# Patient Record
Sex: Female | Born: 1970 | Race: White | Hispanic: No | Marital: Married | Smoking: Never smoker
Health system: Southern US, Community
[De-identification: ages and names within clinical notes are randomized; demographics above are authoritative.]

## PROBLEM LIST (undated history)

## (undated) DIAGNOSIS — I10 Essential (primary) hypertension: Secondary | ICD-10-CM

## (undated) DIAGNOSIS — M7989 Other specified soft tissue disorders: Secondary | ICD-10-CM

## (undated) DIAGNOSIS — F32A Depression, unspecified: Secondary | ICD-10-CM

## (undated) DIAGNOSIS — E039 Hypothyroidism, unspecified: Secondary | ICD-10-CM

## (undated) DIAGNOSIS — R12 Heartburn: Secondary | ICD-10-CM

## (undated) DIAGNOSIS — E669 Obesity, unspecified: Secondary | ICD-10-CM

## (undated) DIAGNOSIS — K829 Disease of gallbladder, unspecified: Secondary | ICD-10-CM

## (undated) HISTORY — DX: Hypothyroidism, unspecified: E03.9

## (undated) HISTORY — DX: Disease of gallbladder, unspecified: K82.9

## (undated) HISTORY — DX: Other specified soft tissue disorders: M79.89

## (undated) HISTORY — DX: Depression, unspecified: F32.A

## (undated) HISTORY — DX: Essential (primary) hypertension: I10

## (undated) HISTORY — DX: Heartburn: R12

---

## 1998-11-07 ENCOUNTER — Other Ambulatory Visit: Admission: RE | Admit: 1998-11-07 | Discharge: 1998-11-07 | Payer: Self-pay | Admitting: Obstetrics and Gynecology

## 1999-11-08 ENCOUNTER — Other Ambulatory Visit: Admission: RE | Admit: 1999-11-08 | Discharge: 1999-11-08 | Payer: Self-pay | Admitting: Obstetrics and Gynecology

## 2000-01-03 ENCOUNTER — Other Ambulatory Visit: Admission: RE | Admit: 2000-01-03 | Discharge: 2000-01-03 | Payer: Self-pay | Admitting: *Deleted

## 2000-05-19 ENCOUNTER — Other Ambulatory Visit: Admission: RE | Admit: 2000-05-19 | Discharge: 2000-05-19 | Payer: Self-pay | Admitting: *Deleted

## 2000-08-14 ENCOUNTER — Ambulatory Visit (HOSPITAL_BASED_OUTPATIENT_CLINIC_OR_DEPARTMENT_OTHER): Admission: RE | Admit: 2000-08-14 | Discharge: 2000-08-14 | Payer: Self-pay | Admitting: Plastic Surgery

## 2000-09-27 ENCOUNTER — Other Ambulatory Visit: Admission: RE | Admit: 2000-09-27 | Discharge: 2000-09-27 | Payer: Self-pay | Admitting: *Deleted

## 2001-01-20 ENCOUNTER — Other Ambulatory Visit: Admission: RE | Admit: 2001-01-20 | Discharge: 2001-01-20 | Payer: Self-pay | Admitting: Obstetrics and Gynecology

## 2001-02-17 ENCOUNTER — Other Ambulatory Visit: Admission: RE | Admit: 2001-02-17 | Discharge: 2001-02-17 | Payer: Self-pay | Admitting: Obstetrics and Gynecology

## 2002-02-22 ENCOUNTER — Other Ambulatory Visit: Admission: RE | Admit: 2002-02-22 | Discharge: 2002-02-22 | Payer: Self-pay | Admitting: Obstetrics and Gynecology

## 2002-05-02 ENCOUNTER — Encounter: Admission: RE | Admit: 2002-05-02 | Discharge: 2002-05-02 | Payer: Self-pay | Admitting: Cardiology

## 2002-05-02 ENCOUNTER — Encounter: Payer: Self-pay | Admitting: Cardiology

## 2002-05-19 ENCOUNTER — Encounter (INDEPENDENT_AMBULATORY_CARE_PROVIDER_SITE_OTHER): Payer: Self-pay | Admitting: Specialist

## 2002-05-19 ENCOUNTER — Observation Stay (HOSPITAL_COMMUNITY): Admission: RE | Admit: 2002-05-19 | Discharge: 2002-05-19 | Payer: Self-pay | Admitting: General Surgery

## 2003-02-10 ENCOUNTER — Emergency Department (HOSPITAL_COMMUNITY): Admission: EM | Admit: 2003-02-10 | Discharge: 2003-02-10 | Payer: Self-pay | Admitting: Emergency Medicine

## 2003-03-02 ENCOUNTER — Other Ambulatory Visit: Admission: RE | Admit: 2003-03-02 | Discharge: 2003-03-02 | Payer: Self-pay | Admitting: Obstetrics and Gynecology

## 2003-03-15 ENCOUNTER — Encounter: Admission: RE | Admit: 2003-03-15 | Discharge: 2003-06-13 | Payer: Self-pay | Admitting: Cardiology

## 2003-04-30 HISTORY — PX: CHOLECYSTECTOMY: SHX55

## 2005-01-30 ENCOUNTER — Other Ambulatory Visit: Admission: RE | Admit: 2005-01-30 | Discharge: 2005-01-30 | Payer: Self-pay | Admitting: Obstetrics and Gynecology

## 2005-08-20 ENCOUNTER — Inpatient Hospital Stay (HOSPITAL_COMMUNITY): Admission: AD | Admit: 2005-08-20 | Discharge: 2005-08-22 | Payer: Self-pay | Admitting: Obstetrics and Gynecology

## 2005-08-29 HISTORY — PX: EYE SURGERY: SHX253

## 2005-09-05 ENCOUNTER — Ambulatory Visit: Admission: RE | Admit: 2005-09-05 | Discharge: 2005-09-05 | Payer: Self-pay | Admitting: Obstetrics and Gynecology

## 2005-09-30 ENCOUNTER — Other Ambulatory Visit: Admission: RE | Admit: 2005-09-30 | Discharge: 2005-09-30 | Payer: Self-pay | Admitting: Obstetrics and Gynecology

## 2007-07-20 ENCOUNTER — Inpatient Hospital Stay (HOSPITAL_COMMUNITY): Admission: AD | Admit: 2007-07-20 | Discharge: 2007-07-20 | Payer: Self-pay | Admitting: Obstetrics and Gynecology

## 2007-08-04 ENCOUNTER — Inpatient Hospital Stay (HOSPITAL_COMMUNITY): Admission: AD | Admit: 2007-08-04 | Discharge: 2007-08-06 | Payer: Self-pay | Admitting: Obstetrics and Gynecology

## 2007-08-07 ENCOUNTER — Encounter: Admission: RE | Admit: 2007-08-07 | Discharge: 2007-09-05 | Payer: Self-pay | Admitting: Obstetrics and Gynecology

## 2007-08-26 ENCOUNTER — Emergency Department (HOSPITAL_COMMUNITY): Admission: EM | Admit: 2007-08-26 | Discharge: 2007-08-26 | Payer: Self-pay | Admitting: Emergency Medicine

## 2009-01-15 ENCOUNTER — Encounter: Admission: RE | Admit: 2009-01-15 | Discharge: 2009-01-15 | Payer: Self-pay | Admitting: Obstetrics and Gynecology

## 2009-08-31 IMAGING — MG MM DIAGNOSTIC BILATERAL
8 series · 8 of 8 positions shown · non-contrast
Comparison: None

CLINICAL DATA: The patient has been experiencing diffuse shooting
pain throughout the upper half of the left breast for 1 month.

DIGITAL DIAGNOSTIC BILATERAL MAMMOGRAM WITH CAD

[R CC (1 of 2)]
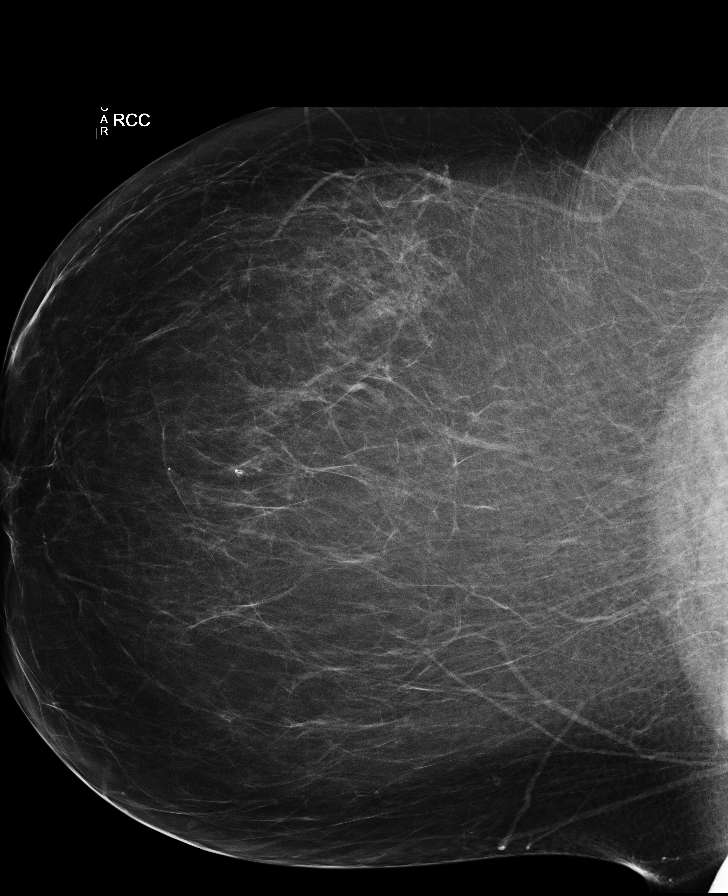

[L CC (1 of 2)]
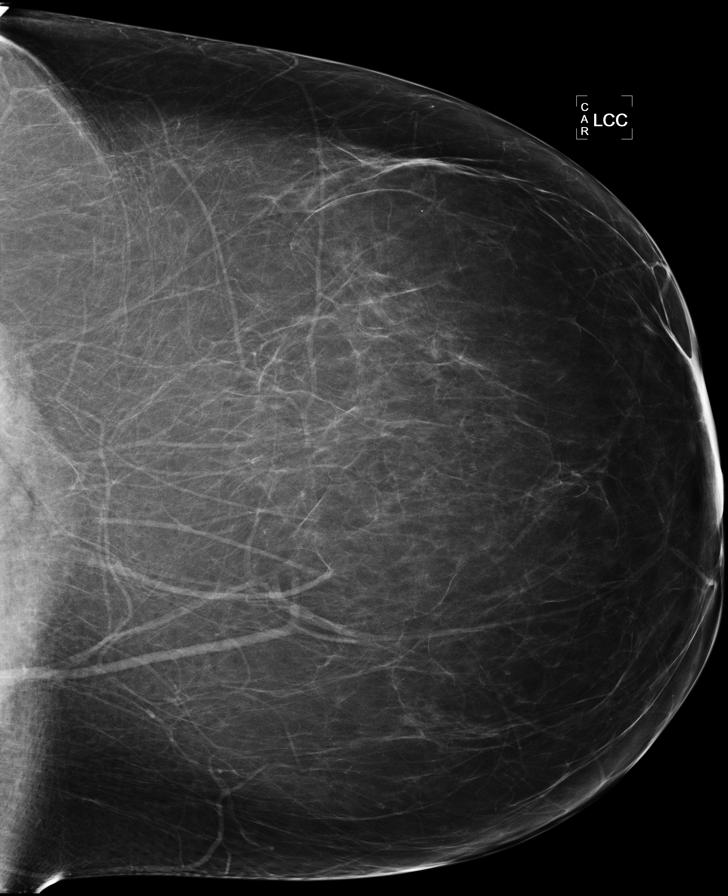

[L MLO (1 of 2)]
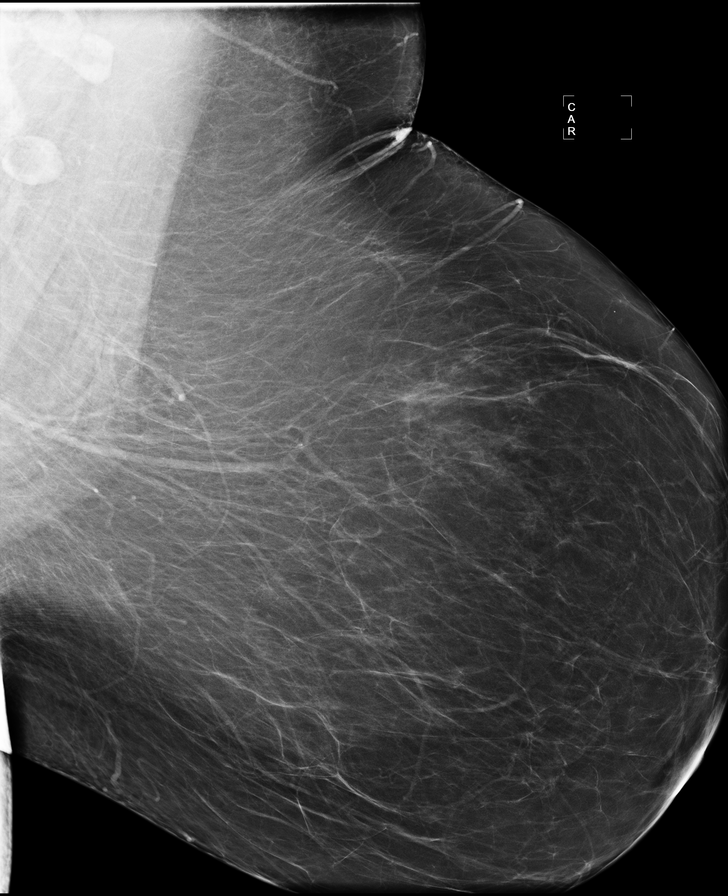

[R MLO (1 of 2)]
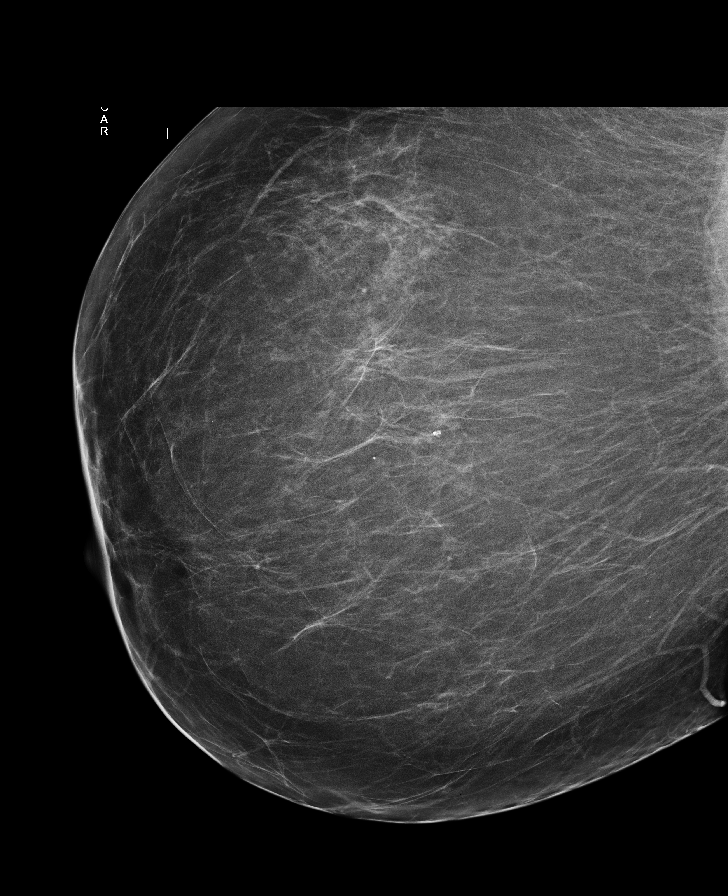

[L CC (2 of 2)]
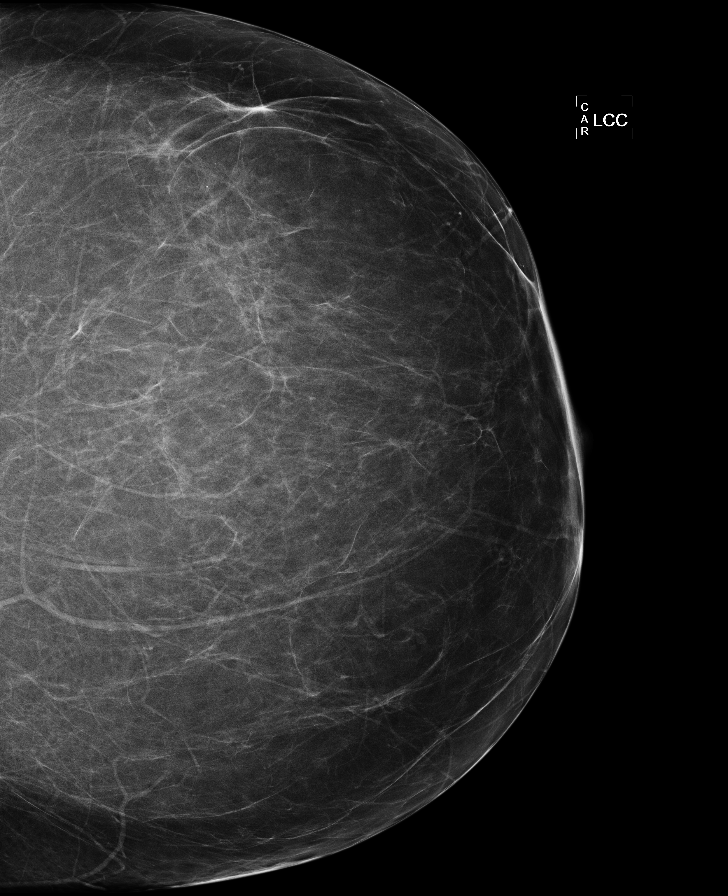

[R CC (2 of 2)]
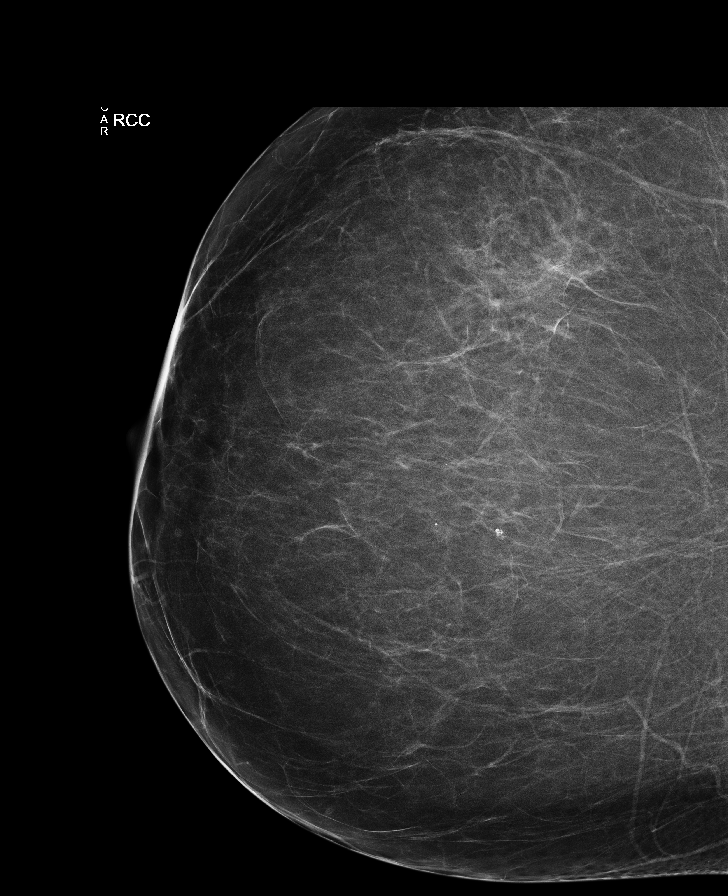

[L MLO (2 of 2)]
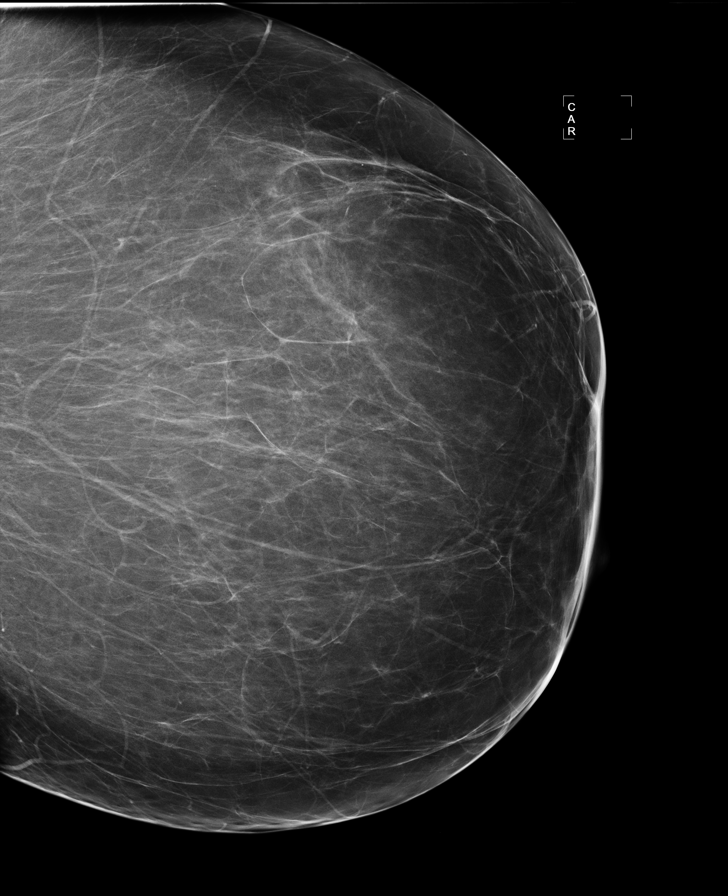

[R MLO (2 of 2)]
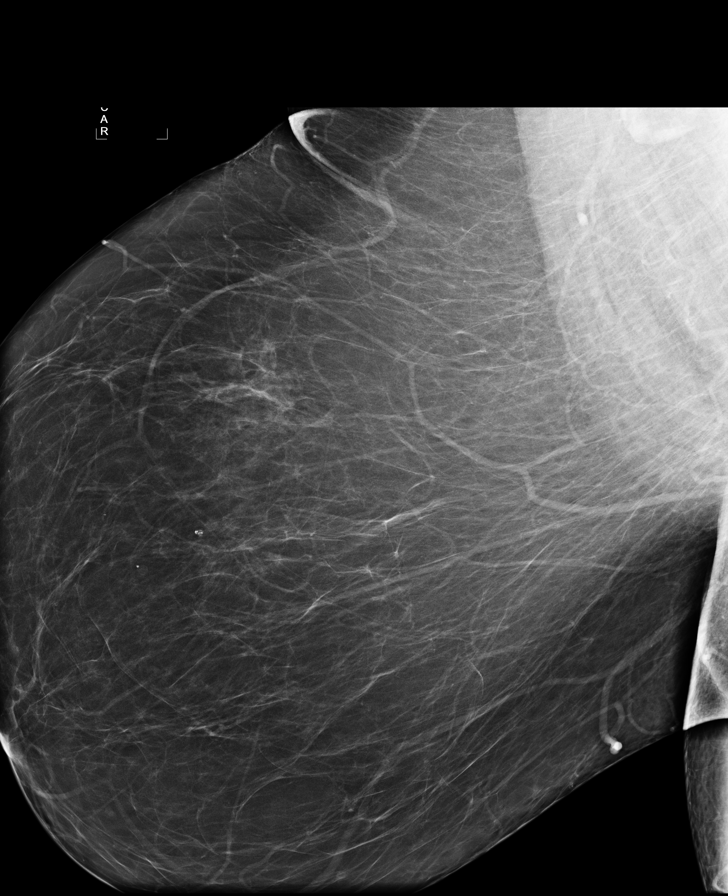

[8 of 8 positions shown; findings below may reference images not displayed]

FINDINGS: The breast tissue is almost entirely fatty.  There is no
dominant mass, architectural distortion or calcification to suggest
malignancy.
IMPRESSION: No mammographic evidence of malignancy.  Yearly screening
mammography should begin at age 40 unless clinically indicated
earlier.

BI-RADS CATEGORY 1:  Negative.

## 2010-03-24 ENCOUNTER — Emergency Department (HOSPITAL_COMMUNITY): Admission: EM | Admit: 2010-03-24 | Discharge: 2010-03-24 | Payer: Self-pay | Admitting: Family Medicine

## 2010-12-15 LAB — POCT RAPID STREP A (OFFICE): Streptococcus, Group A Screen (Direct): NEGATIVE

## 2011-02-14 NOTE — Op Note (Signed)
Tracy [name], Tracy [name]                         ACCOUNT NO.:  [id number]   MEDICAL RECORD NO.:  [id number]                   PATIENT TYPE:  AMB   LOCATION:  DAY                                  FACILITY:  [facility]   PHYSICIAN:  Sharlet Salina T. Hoxworth, M.D.          DATE OF BIRTH:  [birth year]   DATE OF PROCEDURE:  05/19/2002  DATE OF DISCHARGE:                                 OPERATIVE REPORT   PREOPERATIVE DIAGNOSES:  Cholelithiasis and cholecystitis.   POSTOPERATIVE DIAGNOSES:  Cholelithiasis and cholecystitis.   PROCEDURE:  Laparoscopic cholecystectomy.   SURGEON:  Lorne Skeens. Hoxworth, M.D.   ANESTHESIA:  General.   BRIEF HISTORY:  [name] is a 40 year old white female with a recent  episode of severe low substernal and epigastric pain. Cardiac evaluation was  negative and ultrasound has shown a single large gallstone. Laparoscopic  cholecystectomy for symptomatic cholelithiasis has been recommended and  accepted. The nature of the procedure, its indications and risk of bleeding,  infection, bile leak and bile duct injury were discussed and understood  preoperatively. She is now brought to the operating room for this procedure.   DESCRIPTION OF PROCEDURE:  The patient was brought to the operating room,  placed in supine position on the operating table and general endotracheal  anesthesia was induced. She received preoperative antibiotics. The abdomen  was sterilely prepped and draped. PAS were in place. Local anesthesia was  used to infiltrate the trocar sites. A 1 cm incision was made at the  umbilicus and dissection carried down to the midline fascia which was  sharply incised for 1 cm and the peritoneum entered under direct vision.  Through a mattress suture of #0 Vicryl, the Hasson trocar was placed and  pneumoperitoneum established. Under direct vision, a 10 mm trocar was placed  in the subxiphoid area and two 5 mm trocars along the right subcostal  margin. The  gallbladder was visualized and was edematous. The fundus was  grasped and elevated up over the liver and the infundibulum retracted  inferolaterally. Fibrofatty tissue was stripped down off the neck of the  gallbladder toward the porta hepatis. The distal gallbladder was thoroughly  dissected. The cystic artery was clearly seen coursing up onto the  gallbladder wall and was divided between two proximal and one distal clips.  The distal gallbladder cystic duct junction was dissected 360 degrees and  Calot's triangle completely skeletonized. When the anatomy was clear, the  cystic duct was doubly clipped proximally, clipped distally and divided. The  gallbladder was dissected free from its bed using hook cautery and removed  through the umbilicus. Complete hemostasis was assured in the operative site  and the right upper quadrant irrigated and suctioned until clear. Trocars  removed under direct vision and all CO2 evacuated from the peritoneal  cavity. The mattress suture was secured at the umbilicus. The skin incisions  were closed with interrupted subcuticular 4-0 Monocryl and Steri-Strips.  Sponge, needle and instrument counts were correct. Dry sterile dressings  were applied and the patient taken to the recovery room in good condition.                                               Lorne Skeens. Hoxworth, M.D.    Tory Emerald  D:  05/19/2002  T:  05/20/2002  Job:  [id number]

## 2011-07-08 LAB — CBC
HCT: 36.6
Hemoglobin: 12.6
Hemoglobin: 9.7 — ABNORMAL LOW
MCHC: 34.7
MCV: 85.8
MCV: 86.1
Platelets: 263
RBC: 3.26 — ABNORMAL LOW
RDW: 14.6 — ABNORMAL HIGH
RDW: 14.7 — ABNORMAL HIGH

## 2011-07-09 LAB — COMPREHENSIVE METABOLIC PANEL
ALT: 18
AST: 20
Albumin: 2.6 — ABNORMAL LOW
CO2: 23
Calcium: 8.8
Creatinine, Ser: 0.7
GFR calc Af Amer: >60
Sodium: 135
Total Protein: 5.6 — ABNORMAL LOW

## 2011-07-09 LAB — CBC
MCHC: 34.8
MCV: 85
Platelets: 268
RBC: 3.92
WBC: 12.3 — ABNORMAL HIGH

## 2013-07-25 ENCOUNTER — Other Ambulatory Visit: Payer: Self-pay

## 2013-07-25 DIAGNOSIS — Z1231 Encounter for screening mammogram for malignant neoplasm of breast: Secondary | ICD-10-CM

## 2013-10-03 ENCOUNTER — Ambulatory Visit: Payer: Self-pay

## 2014-01-24 ENCOUNTER — Encounter (HOSPITAL_COMMUNITY): Payer: Self-pay | Admitting: Emergency Medicine

## 2014-01-24 ENCOUNTER — Telehealth: Payer: Self-pay | Admitting: Cardiology

## 2014-01-24 ENCOUNTER — Emergency Department (HOSPITAL_COMMUNITY)
Admission: EM | Admit: 2014-01-24 | Discharge: 2014-01-24 | Disposition: A | Discharge status: Home / Self Care | Payer: PRIVATE HEALTH INSURANCE | Attending: Emergency Medicine | Admitting: Emergency Medicine

## 2014-01-24 ENCOUNTER — Emergency Department (HOSPITAL_COMMUNITY): Payer: PRIVATE HEALTH INSURANCE

## 2014-01-24 DIAGNOSIS — E785 Hyperlipidemia, unspecified: Secondary | ICD-10-CM | POA: Insufficient documentation

## 2014-01-24 DIAGNOSIS — R110 Nausea: Secondary | ICD-10-CM | POA: Insufficient documentation

## 2014-01-24 DIAGNOSIS — E669 Obesity, unspecified: Secondary | ICD-10-CM | POA: Insufficient documentation

## 2014-01-24 DIAGNOSIS — M549 Dorsalgia, unspecified: Secondary | ICD-10-CM | POA: Insufficient documentation

## 2014-01-24 DIAGNOSIS — J189 Pneumonia, unspecified organism: Secondary | ICD-10-CM

## 2014-01-24 DIAGNOSIS — I10 Essential (primary) hypertension: Secondary | ICD-10-CM | POA: Insufficient documentation

## 2014-01-24 DIAGNOSIS — J159 Unspecified bacterial pneumonia: Secondary | ICD-10-CM | POA: Insufficient documentation

## 2014-01-24 DIAGNOSIS — Z79899 Other long term (current) drug therapy: Secondary | ICD-10-CM | POA: Insufficient documentation

## 2014-01-24 DIAGNOSIS — R079 Chest pain, unspecified: Secondary | ICD-10-CM

## 2014-01-24 DIAGNOSIS — E119 Type 2 diabetes mellitus without complications: Secondary | ICD-10-CM | POA: Insufficient documentation

## 2014-01-24 HISTORY — DX: Obesity, unspecified: E66.9

## 2014-01-24 LAB — CBC
HCT: 35.3 % — ABNORMAL LOW (ref 36.0–46.0)
Hemoglobin: 11.8 g/dL — ABNORMAL LOW (ref 12.0–15.0)
MCH: 29.5 pg (ref 26.0–34.0)
MCHC: 33.4 g/dL (ref 30.0–36.0)
MCV: 88.3 fL (ref 78.0–100.0)
Platelets: 194 10*3/uL (ref 150–400)
RBC: 4 MIL/uL (ref 3.87–5.11)
RDW: 12.7 % (ref 11.5–15.5)
WBC: 9.4 10*3/uL (ref 4.0–10.5)

## 2014-01-24 LAB — COMPREHENSIVE METABOLIC PANEL
ALBUMIN: 3.7 g/dL (ref 3.5–5.2)
ALT: 16 U/L (ref 0–35)
AST: 18 U/L (ref 0–37)
Alkaline Phosphatase: 43 U/L (ref 39–117)
BILIRUBIN TOTAL: 0.4 mg/dL (ref 0.3–1.2)
BUN: 11 mg/dL (ref 6–23)
CO2: 24 mEq/L (ref 19–32)
CREATININE: 0.77 mg/dL (ref 0.50–1.10)
Calcium: 8.8 mg/dL (ref 8.4–10.5)
Chloride: 103 mEq/L (ref 96–112)
GFR calc non Af Amer: >90 mL/min (ref >90)
Glucose, Bld: 106 mg/dL — ABNORMAL HIGH (ref 70–99)
Potassium: 4.1 mEq/L (ref 3.7–5.3)
Sodium: 141 mEq/L (ref 137–147)
TOTAL PROTEIN: 7.2 g/dL (ref 6.0–8.3)

## 2014-01-24 LAB — I-STAT TROPONIN, ED
TROPONIN I, POC: 0 ng/mL (ref 0.00–0.08)
TROPONIN I, POC: 0.01 ng/mL (ref 0.00–0.08)

## 2014-01-24 LAB — LIPASE, BLOOD: Lipase: 48 U/L (ref 11–59)

## 2014-01-24 MED ORDER — ASPIRIN 81 MG PO CHEW
324.0000 mg | CHEWABLE_TABLET | Freq: Once | ORAL | Status: DC
  Filled 2014-01-24: qty 4

## 2014-01-24 MED ORDER — NITROGLYCERIN 2 % TD OINT
1.0000 [in_us] | TOPICAL_OINTMENT | Freq: Once | TRANSDERMAL | Status: AC
  Administered 2014-01-24: 1 [in_us] via TOPICAL
  Filled 2014-01-24: qty 1

## 2014-01-24 MED ORDER — AZITHROMYCIN 250 MG PO TABS: 250.0000 mg | ORAL_TABLET | Freq: Every day | ORAL | Status: DC

## 2014-01-24 NOTE — ED Notes (Signed)
Pt reports going to pcp this am for eval of mid chest pains, having a cough and  Nausea this am. Also reports a pressure pain to her back. ekg done and pt was sent here for possible ekg changes.

## 2014-01-24 NOTE — ED Notes (Signed)
Pt c/o chest pressure that started last night, radiates into back. Pt c/o extreme nausea this morning, felt a little lightheaded. Went to urgent care, did ekg thought it looked different,  so sent the pt here for further eval. Pt sts she is having slight central chest pressure now, not feeling nauseous now. Nad, skin warm and dry, resp e/u. Took 3 ASA at home and given 4 ASA at urgent care.

## 2014-01-24 NOTE — Discharge Instructions (Signed)
Chest Pain (Nonspecific) °It is often hard to give a specific diagnosis for the cause of chest pain. There is always a chance that your pain could be related to something serious, such as a heart attack or a blood clot in the lungs. You need to follow up with your caregiver for further evaluation. °CAUSES  °· Heartburn. °· Pneumonia or bronchitis. °· Anxiety or stress. °· Inflammation around your heart (pericarditis) or lung (pleuritis or pleurisy). °· A blood clot in the lung. °· A collapsed lung (pneumothorax). It can develop suddenly on its own (spontaneous pneumothorax) or from injury (trauma) to the chest. °· Shingles infection (herpes zoster virus). °The chest wall is composed of bones, muscles, and cartilage. Any of these can be the source of the pain. °· The bones can be bruised by injury. °· The muscles or cartilage can be strained by coughing or overwork. °· The cartilage can be affected by inflammation and become sore (costochondritis). °DIAGNOSIS  °Lab tests or other studies, such as X-rays, electrocardiography, stress testing, or cardiac imaging, may be needed to find the cause of your pain.  °TREATMENT  °· Treatment depends on what may be causing your chest pain. Treatment may include: °· Acid blockers for heartburn. °· Anti-inflammatory medicine. °· Pain medicine for inflammatory conditions. °· Antibiotics if an infection is present. °· You may be advised to change lifestyle habits. This includes stopping smoking and avoiding alcohol, caffeine, and chocolate. °· You may be advised to keep your head raised (elevated) when sleeping. This reduces the chance of acid going backward from your stomach into your esophagus. °· Most of the time, nonspecific chest pain will improve within 2 to 3 days with rest and mild pain medicine. °HOME CARE INSTRUCTIONS  °· If antibiotics were prescribed, take your antibiotics as directed. Finish them even if you start to feel better. °· For the next few days, avoid physical  activities that bring on chest pain. Continue physical activities as directed. °· Do not smoke. °· Avoid drinking alcohol. °· Only take over-the-counter or prescription medicine for pain, discomfort, or fever as directed by your caregiver. °· Follow your caregiver's suggestions for further testing if your chest pain does not go away. °· Keep any follow-up appointments you made. If you do not go to an appointment, you could develop lasting (chronic) problems with pain. If there is any problem keeping an appointment, you must call to reschedule. °SEEK MEDICAL CARE IF:  °· You think you are having problems from the medicine you are taking. Read your medicine instructions carefully. °· Your chest pain does not go away, even after treatment. °· You develop a rash with blisters on your chest. °SEEK IMMEDIATE MEDICAL CARE IF:  °· You have increased chest pain or pain that spreads to your arm, neck, jaw, back, or abdomen. °· You develop shortness of breath, an increasing cough, or you are coughing up blood. °· You have severe back or abdominal pain, feel nauseous, or vomit. °· You develop severe weakness, fainting, or chills. °· You have a fever. °THIS IS AN EMERGENCY. Do not wait to see if the pain will go away. Get medical help at once. Call your local emergency services (911 in [place] Do not drive yourself to the hospital. °MAKE SURE YOU:  °· Understand these instructions. °· Will watch your condition. °· Will get help right away if you are not doing well or get worse. °Document Released: 06/25/2005 Document Revised: 12/08/2011 Document Reviewed: 04/20/2008 °ExitCare® Patient Information ©2014 [employer]   [employer]. ° °Pneumonia, Adult °Pneumonia is an infection of the lungs.  °CAUSES °Pneumonia may be caused by bacteria or a virus. Usually, these infections are caused by breathing infectious particles into the lungs (respiratory tract). °SYMPTOMS  °· Cough. °· Fever. °· Chest pain. °· Increased rate of  breathing. °· Wheezing. °· Mucus production. °DIAGNOSIS  °If you have the common symptoms of pneumonia, your caregiver will typically confirm the diagnosis with a chest X-ray. The X-ray will show an abnormality in the lung (pulmonary infiltrate) if you have pneumonia. Other tests of your blood, urine, or sputum may be done to find the specific cause of your pneumonia. Your caregiver may also do tests (blood gases or pulse oximetry) to see how well your lungs are working. °TREATMENT  °Some forms of pneumonia may be spread to other people when you cough or sneeze. You may be asked to wear a mask before and during your exam. Pneumonia that is caused by bacteria is treated with antibiotic medicine. Pneumonia that is caused by the influenza virus may be treated with an antiviral medicine. Most other viral infections must run their course. These infections will not respond to antibiotics.  °PREVENTION °A pneumococcal shot (vaccine) is available to prevent a common bacterial cause of pneumonia. This is usually suggested for: °· People over 65 years old. °· Patients on chemotherapy. °· People with chronic lung problems, such as bronchitis or emphysema. °· People with immune system problems. °If you are over 65 or have a high risk condition, you may receive the pneumococcal vaccine if you have not received it before. In some countries, a routine influenza vaccine is also recommended. This vaccine can help prevent some cases of pneumonia. You may be offered the influenza vaccine as part of your care. °If you smoke, it is time to quit. You may receive instructions on how to stop smoking. Your caregiver can provide medicines and counseling to help you quit. °HOME CARE INSTRUCTIONS  °· Cough suppressants may be used if you are losing too much rest. However, coughing protects you by clearing your lungs. You should avoid using cough suppressants if you can. °· Your caregiver may have prescribed medicine if he or she thinks your  pneumonia is caused by a bacteria or influenza. Finish your medicine even if you start to feel better. °· Your caregiver may also prescribe an expectorant. This loosens the mucus to be coughed up. °· Only take over-the-counter or prescription medicines for pain, discomfort, or fever as directed by your caregiver. °· Do not smoke. Smoking is a common cause of bronchitis and can contribute to pneumonia. If you are a smoker and continue to smoke, your cough may last several weeks after your pneumonia has cleared. °· A cold steam vaporizer or humidifier in your room or home may help loosen mucus. °· Coughing is often worse at night. Sleeping in a semi-upright position in a recliner or using a couple pillows under your head will help with this. °· Get rest as you feel it is needed. Your body will usually let you know when you need to rest. °SEEK IMMEDIATE MEDICAL CARE IF:  °· Your illness becomes worse. This is especially true if you are elderly or weakened from any other disease. °· You cannot control your cough with suppressants and are losing sleep. °· You begin coughing up blood. °· You develop pain which is getting worse or is uncontrolled with medicines. °· You have a fever. °· Any of the symptoms which initially brought you   in for treatment are getting worse rather than better. °· You develop shortness of breath or chest pain. °MAKE SURE YOU:  °· Understand these instructions. °· Will watch your condition. °· Will get help right away if you are not doing well or get worse. °Document Released: 09/15/2005 Document Revised: 12/08/2011 Document Reviewed: 12/05/2010 °ExitCare® Patient Information ©2014 [employer]. ° °

## 2014-01-24 NOTE — ED Provider Notes (Signed)
CSN: [id number]     Arrival date & time 01/24/14  [id number] History   First MD Initiated Contact with Patient 01/24/14 1012     Chief Complaint  Patient presents with  . Chest Pain  . Back Pain     (Consider location/radiation/quality/duration/timing/severity/associated sxs/prior Treatment) HPI  This is a 43 year old female with history of hyperthyroidism who presents with cough, chest pain, and nausea. Patient reports she had upper chest pain and back pain that started last night. It is pressure-like and nonradiating. She states that it is 2/10. She took an aspirin last night and got better. Patient reports a nonproductive cough as well. She denies any fever or chills. She does endorse nausea. Patient reports that she had a full cardiac workup approximately 10 years ago. She denies any history of high blood pressure, high cholesterol. She does note an early family history of heart disease in her grandmother and her aunt. She denies any leg swelling or history of blood clots.  Past Medical History  Diagnosis Date  . Obesity    History reviewed. No pertinent past surgical history. History reviewed. No pertinent family history. History  Substance Use Topics  . Smoking status: Not on file  . Smokeless tobacco: Not on file  . Alcohol Use: No   OB History   Grav Para Term Preterm Abortions TAB SAB Ect Mult Living                 Review of Systems  Constitutional: Negative for fever.  Respiratory: Positive for cough and chest tightness. Negative for shortness of breath and wheezing.   Cardiovascular: Negative for chest pain.  Gastrointestinal: Positive for nausea. Negative for vomiting and abdominal pain.  Genitourinary: Negative for dysuria.  Musculoskeletal: Positive for back pain.  Neurological: Negative for headaches.  Psychiatric/Behavioral: Negative for confusion.  All other systems reviewed and are negative.     Allergies  Cephalosporins and Ciprofloxacin  Home  Medications   Prior to Admission medications   Medication Sig Start Date End Date Taking? Authorizing Provider  aspirin 81 MG tablet Take 162 mg by mouth daily as needed for pain.   Yes Historical Provider, MD  ibuprofen (ADVIL,MOTRIN) 200 MG tablet Take 600 mg by mouth daily as needed for moderate pain.   Yes Historical Provider, MD  Multiple Vitamins-Minerals (MULTIVITAMIN PO) Take 1 tablet by mouth daily.   Yes Historical Provider, MD  SYNTHROID 150 MCG tablet Take 150 mcg by mouth daily. 01/19/14  Yes Historical Provider, MD   BP 158/81  Pulse 89  Temp(Src) 98.4 F (36.9 C) (Oral)  Resp 15  SpO2 100%  LMP 01/17/2014 Physical Exam  Nursing note and vitals reviewed. Constitutional: She is oriented to person, place, and time. She appears well-developed and well-nourished.  Obese  HENT:  Head: Normocephalic and atraumatic.  Eyes: Pupils are equal, round, and reactive to light.  Neck: Neck supple. No JVD present.  Cardiovascular: Normal rate, regular rhythm and normal heart sounds.   No murmur heard. Pulmonary/Chest: Effort normal and breath sounds normal. No respiratory distress. She has no wheezes.  Abdominal: Soft. There is no tenderness.  Musculoskeletal: She exhibits no edema.  Neurological: She is alert and oriented to person, place, and time.  Skin: Skin is warm and dry.  Psychiatric: She has a normal mood and affect.    ED Course  Procedures (including critical care time) Labs Review Labs Reviewed  CBC - Abnormal; Notable for the following:    Hemoglobin 11.8 (*)  HCT 35.3 (*)    All other components within normal limits  COMPREHENSIVE METABOLIC PANEL - Abnormal; Notable for the following:    Glucose, Bld 106 (*)    All other components within normal limits  LIPASE, BLOOD  I-STAT TROPOININ, ED  Rosezena SensorI-STAT TROPOININ, ED    Imaging Review Dg Chest 2 View  01/24/2014   CLINICAL DATA:  Chest pain and back pain.  Cough.  EXAM: CHEST  2 VIEW  COMPARISON:  None.   FINDINGS: There is a patchy infiltrate at the right lung base. Lungs are otherwise clear. Heart size and vascularity are normal. No effusions. The No osseous abnormality.  IMPRESSION: Faint infiltrate at the right lung base consistent with pneumonia.   Electronically Signed   By: Geanie CooleyJim  Maxwell M.D.   On: 01/24/2014 10:44     EKG Interpretation None      EKG independently reviewed by myself: Normal sinus rhythm with a rate of 94, no evidence of acute ST elevation or ischemia.  MDM   Final diagnoses:  Community acquired pneumonia  Chest pain    Patient presents with chest pain, cough, and nausea. She is nontoxic on exam. EKG is nonischemic.  Patient is low risk for ACS and with a history of diabetes, hypertension, hyperlipidemia. Only risk factor is obesity and family history. Patient also reports cough. Basic labwork was obtained. Chest x-ray shows evidence of right lung base infiltrate consistent with pneumonia. Given patient's cough, will treat with azithromycin. Delta troponin is negative. Patient to followup with cardiology.  After history, exam, and medical workup I feel the patient has been appropriately medically screened and is safe for discharge home. Pertinent diagnoses were discussed with the patient. Patient was given return precautions.     Shon Batonourtney F Horton, MD 01/24/14 1919

## 2014-01-24 NOTE — Telephone Encounter (Signed)
New Message:  Pt states she went to an urgent care and they saw something abnormal on her EKG... States she went to the ER and they told her that her EKG was fine, her troponin levels were good, but she had pneumonia. Pt is requesting Juliette AlcideMelinda call her back and let her know if there is anything else she should do.Marland Kitchen..Marland Kitchen

## 2014-01-24 NOTE — Telephone Encounter (Signed)
Left message to call back  

## 2014-01-24 NOTE — Telephone Encounter (Signed)
Spoke with patient and she went to Urgent Care and EKG abnormal but ED report per Ross Marcusourtney Horton MD-EKG independently reviewed by myself: Normal sinus rhythm with a rate of 94, no evidence of acute ST elevation or ischemia.  Unable to view EKG in chart. Will follow up tomorrow and discuss with  Dr. Patty SermonsBrackbill when he is back in the office

## 2014-01-25 NOTE — Telephone Encounter (Signed)
EKG reviewed by  Dr. Patty SermonsBrackbill and he agreed interpretation. Advised patient

## 2014-09-25 ENCOUNTER — Other Ambulatory Visit: Payer: Self-pay | Admitting: Obstetrics and Gynecology

## 2014-09-27 LAB — CYTOLOGY - PAP

## 2020-02-01 ENCOUNTER — Encounter: Payer: Self-pay | Attending: Family Medicine | Admitting: Registered"

## 2021-03-22 DIAGNOSIS — F411 Generalized anxiety disorder: Secondary | ICD-10-CM | POA: Diagnosis not present

## 2021-04-08 DIAGNOSIS — Z6827 Body mass index (BMI) 27.0-27.9, adult: Secondary | ICD-10-CM | POA: Diagnosis not present

## 2021-04-08 DIAGNOSIS — Z1231 Encounter for screening mammogram for malignant neoplasm of breast: Secondary | ICD-10-CM | POA: Diagnosis not present

## 2021-04-08 DIAGNOSIS — Z01419 Encounter for gynecological examination (general) (routine) without abnormal findings: Secondary | ICD-10-CM | POA: Diagnosis not present

## 2021-04-16 DIAGNOSIS — F5081 Binge eating disorder: Secondary | ICD-10-CM | POA: Diagnosis not present

## 2021-04-16 DIAGNOSIS — Z0001 Encounter for general adult medical examination with abnormal findings: Secondary | ICD-10-CM | POA: Diagnosis not present

## 2021-04-16 DIAGNOSIS — I10 Essential (primary) hypertension: Secondary | ICD-10-CM | POA: Diagnosis not present

## 2021-04-16 DIAGNOSIS — E611 Iron deficiency: Secondary | ICD-10-CM | POA: Diagnosis not present

## 2021-04-16 DIAGNOSIS — E039 Hypothyroidism, unspecified: Secondary | ICD-10-CM | POA: Diagnosis not present

## 2021-04-25 DIAGNOSIS — F411 Generalized anxiety disorder: Secondary | ICD-10-CM | POA: Diagnosis not present

## 2021-05-23 DIAGNOSIS — F411 Generalized anxiety disorder: Secondary | ICD-10-CM | POA: Diagnosis not present

## 2021-07-22 DIAGNOSIS — F411 Generalized anxiety disorder: Secondary | ICD-10-CM | POA: Diagnosis not present

## 2021-07-29 DIAGNOSIS — Z1211 Encounter for screening for malignant neoplasm of colon: Secondary | ICD-10-CM | POA: Diagnosis not present

## 2021-09-09 DIAGNOSIS — F411 Generalized anxiety disorder: Secondary | ICD-10-CM | POA: Diagnosis not present

## 2021-10-07 DIAGNOSIS — F411 Generalized anxiety disorder: Secondary | ICD-10-CM | POA: Diagnosis not present

## 2021-10-08 DIAGNOSIS — K5730 Diverticulosis of large intestine without perforation or abscess without bleeding: Secondary | ICD-10-CM | POA: Diagnosis not present

## 2021-10-08 DIAGNOSIS — Z1211 Encounter for screening for malignant neoplasm of colon: Secondary | ICD-10-CM | POA: Diagnosis not present

## 2021-10-08 DIAGNOSIS — K6389 Other specified diseases of intestine: Secondary | ICD-10-CM | POA: Diagnosis not present

## 2021-11-05 DIAGNOSIS — F411 Generalized anxiety disorder: Secondary | ICD-10-CM | POA: Diagnosis not present

## 2022-01-07 DIAGNOSIS — Z6841 Body Mass Index (BMI) 40.0 and over, adult: Secondary | ICD-10-CM | POA: Diagnosis not present

## 2022-01-07 DIAGNOSIS — E039 Hypothyroidism, unspecified: Secondary | ICD-10-CM | POA: Diagnosis not present

## 2022-01-07 DIAGNOSIS — I10 Essential (primary) hypertension: Secondary | ICD-10-CM | POA: Diagnosis not present

## 2022-01-07 DIAGNOSIS — N959 Unspecified menopausal and perimenopausal disorder: Secondary | ICD-10-CM | POA: Diagnosis not present

## 2022-01-07 DIAGNOSIS — E611 Iron deficiency: Secondary | ICD-10-CM | POA: Diagnosis not present

## 2022-01-07 DIAGNOSIS — E559 Vitamin D deficiency, unspecified: Secondary | ICD-10-CM | POA: Diagnosis not present

## 2022-01-07 DIAGNOSIS — Z0001 Encounter for general adult medical examination with abnormal findings: Secondary | ICD-10-CM | POA: Diagnosis not present

## 2022-01-14 DIAGNOSIS — F411 Generalized anxiety disorder: Secondary | ICD-10-CM | POA: Diagnosis not present

## 2022-01-29 DIAGNOSIS — E2839 Other primary ovarian failure: Secondary | ICD-10-CM | POA: Diagnosis not present

## 2022-01-29 DIAGNOSIS — N951 Menopausal and female climacteric states: Secondary | ICD-10-CM | POA: Diagnosis not present

## 2022-03-12 DIAGNOSIS — E039 Hypothyroidism, unspecified: Secondary | ICD-10-CM | POA: Diagnosis not present

## 2022-03-12 DIAGNOSIS — N951 Menopausal and female climacteric states: Secondary | ICD-10-CM | POA: Diagnosis not present

## 2022-03-12 DIAGNOSIS — E611 Iron deficiency: Secondary | ICD-10-CM | POA: Diagnosis not present

## 2022-03-12 DIAGNOSIS — E2839 Other primary ovarian failure: Secondary | ICD-10-CM | POA: Diagnosis not present

## 2022-03-25 DIAGNOSIS — F411 Generalized anxiety disorder: Secondary | ICD-10-CM | POA: Diagnosis not present

## 2022-04-15 DIAGNOSIS — F411 Generalized anxiety disorder: Secondary | ICD-10-CM | POA: Diagnosis not present

## 2022-04-17 DIAGNOSIS — E039 Hypothyroidism, unspecified: Secondary | ICD-10-CM | POA: Diagnosis not present

## 2022-04-17 DIAGNOSIS — I10 Essential (primary) hypertension: Secondary | ICD-10-CM | POA: Diagnosis not present

## 2022-04-17 DIAGNOSIS — N951 Menopausal and female climacteric states: Secondary | ICD-10-CM | POA: Diagnosis not present

## 2022-04-17 DIAGNOSIS — E611 Iron deficiency: Secondary | ICD-10-CM | POA: Diagnosis not present

## 2022-04-22 DIAGNOSIS — E039 Hypothyroidism, unspecified: Secondary | ICD-10-CM | POA: Diagnosis not present

## 2022-04-22 DIAGNOSIS — I10 Essential (primary) hypertension: Secondary | ICD-10-CM | POA: Diagnosis not present

## 2022-04-22 DIAGNOSIS — E559 Vitamin D deficiency, unspecified: Secondary | ICD-10-CM | POA: Diagnosis not present

## 2022-04-22 DIAGNOSIS — E611 Iron deficiency: Secondary | ICD-10-CM | POA: Diagnosis not present

## 2022-05-13 DIAGNOSIS — F411 Generalized anxiety disorder: Secondary | ICD-10-CM | POA: Diagnosis not present

## 2022-06-18 DIAGNOSIS — D649 Anemia, unspecified: Secondary | ICD-10-CM | POA: Diagnosis not present

## 2022-06-18 DIAGNOSIS — Z1231 Encounter for screening mammogram for malignant neoplasm of breast: Secondary | ICD-10-CM | POA: Diagnosis not present

## 2022-06-18 DIAGNOSIS — Z01419 Encounter for gynecological examination (general) (routine) without abnormal findings: Secondary | ICD-10-CM | POA: Diagnosis not present

## 2022-06-18 DIAGNOSIS — Z6839 Body mass index (BMI) 39.0-39.9, adult: Secondary | ICD-10-CM | POA: Diagnosis not present

## 2022-07-09 DIAGNOSIS — F411 Generalized anxiety disorder: Secondary | ICD-10-CM | POA: Diagnosis not present

## 2022-07-15 DIAGNOSIS — D649 Anemia, unspecified: Secondary | ICD-10-CM | POA: Diagnosis not present

## 2022-07-15 DIAGNOSIS — N911 Secondary amenorrhea: Secondary | ICD-10-CM | POA: Diagnosis not present

## 2022-07-15 DIAGNOSIS — E8941 Symptomatic postprocedural ovarian failure: Secondary | ICD-10-CM | POA: Diagnosis not present

## 2022-08-11 DIAGNOSIS — F411 Generalized anxiety disorder: Secondary | ICD-10-CM | POA: Diagnosis not present

## 2022-08-26 DIAGNOSIS — R946 Abnormal results of thyroid function studies: Secondary | ICD-10-CM | POA: Diagnosis not present

## 2022-09-09 DIAGNOSIS — N958 Other specified menopausal and perimenopausal disorders: Secondary | ICD-10-CM | POA: Diagnosis not present

## 2022-09-09 DIAGNOSIS — F411 Generalized anxiety disorder: Secondary | ICD-10-CM | POA: Diagnosis not present

## 2022-09-09 DIAGNOSIS — E039 Hypothyroidism, unspecified: Secondary | ICD-10-CM | POA: Diagnosis not present

## 2022-11-14 DIAGNOSIS — F411 Generalized anxiety disorder: Secondary | ICD-10-CM | POA: Diagnosis not present

## 2022-12-01 DIAGNOSIS — F411 Generalized anxiety disorder: Secondary | ICD-10-CM | POA: Diagnosis not present

## 2022-12-05 DIAGNOSIS — Z1321 Encounter for screening for nutritional disorder: Secondary | ICD-10-CM | POA: Diagnosis not present

## 2022-12-05 DIAGNOSIS — Z131 Encounter for screening for diabetes mellitus: Secondary | ICD-10-CM | POA: Diagnosis not present

## 2022-12-05 DIAGNOSIS — Z13220 Encounter for screening for lipoid disorders: Secondary | ICD-10-CM | POA: Diagnosis not present

## 2022-12-05 DIAGNOSIS — Z13228 Encounter for screening for other metabolic disorders: Secondary | ICD-10-CM | POA: Diagnosis not present

## 2022-12-18 DIAGNOSIS — R944 Abnormal results of kidney function studies: Secondary | ICD-10-CM | POA: Diagnosis not present

## 2023-01-06 DIAGNOSIS — Z0289 Encounter for other administrative examinations: Secondary | ICD-10-CM

## 2023-01-07 ENCOUNTER — Encounter (INDEPENDENT_AMBULATORY_CARE_PROVIDER_SITE_OTHER): Payer: Self-pay | Admitting: Family Medicine

## 2023-01-12 DIAGNOSIS — F411 Generalized anxiety disorder: Secondary | ICD-10-CM | POA: Diagnosis not present

## 2023-01-19 DIAGNOSIS — K5792 Diverticulitis of intestine, part unspecified, without perforation or abscess without bleeding: Secondary | ICD-10-CM | POA: Diagnosis not present

## 2023-01-28 ENCOUNTER — Ambulatory Visit (INDEPENDENT_AMBULATORY_CARE_PROVIDER_SITE_OTHER): Payer: BC Managed Care – PPO | Admitting: Family Medicine

## 2023-01-28 ENCOUNTER — Encounter (INDEPENDENT_AMBULATORY_CARE_PROVIDER_SITE_OTHER): Payer: Self-pay | Admitting: Family Medicine

## 2023-01-28 VITALS — BP 139/83 | HR 64 | Temp 97.4°F | Ht 62.0 in | Wt 240.8 lb

## 2023-01-28 DIAGNOSIS — Z1331 Encounter for screening for depression: Secondary | ICD-10-CM | POA: Insufficient documentation

## 2023-01-28 DIAGNOSIS — I158 Other secondary hypertension: Secondary | ICD-10-CM | POA: Insufficient documentation

## 2023-01-28 DIAGNOSIS — R5383 Other fatigue: Secondary | ICD-10-CM | POA: Insufficient documentation

## 2023-01-28 DIAGNOSIS — Z6841 Body Mass Index (BMI) 40.0 and over, adult: Secondary | ICD-10-CM

## 2023-01-28 DIAGNOSIS — E038 Other specified hypothyroidism: Secondary | ICD-10-CM | POA: Insufficient documentation

## 2023-01-28 DIAGNOSIS — F32A Depression, unspecified: Secondary | ICD-10-CM | POA: Insufficient documentation

## 2023-01-28 DIAGNOSIS — F3289 Other specified depressive episodes: Secondary | ICD-10-CM | POA: Diagnosis not present

## 2023-01-28 DIAGNOSIS — R0602 Shortness of breath: Secondary | ICD-10-CM | POA: Insufficient documentation

## 2023-01-28 NOTE — Progress Notes (Signed)
Tracy [name], D.O.  ABFM, ABOM Specializing in Clinical Bariatric Medicine Office located at: [address] [place], [place]  [address]     Initial Bariatric Medicine Consultation Visit  Dear Tracy Davenport, MD   Thank you for referring Tracy [name] to our clinic today for evaluation.  We performed a consultation to discuss her options for treatment and educate the patient on her disease state.  The following note includes my evaluation and treatment recommendations.   Please do not hesitate to reach out to me directly if you have any further concerns.    Assessment and Plan:   Orders Placed This Encounter  Procedures   CBC with Differential/Platelet   Comprehensive metabolic panel   Hemoglobin A1c   Folate   Insulin, random   Lipid panel   T3   T4, free   TSH   VITAMIN D 25 Hydroxy (Vit-D Deficiency, Fractures)   Vitamin B12   EKG 12-Lead    Medications Discontinued During This Encounter  Medication Reason   SYNTHROID 150 MCG tablet Change in therapy   aspirin 81 MG tablet Patient Preference   azithromycin (ZITHROMAX) 250 MG tablet Completed Course     1) Fatigue Assessment: Condition is Not optimized. Tracy [name] does feel that her weight is causing her energy to be lower than it should be. Fatigue may be related to obesity, depression or many other causes. she does not appear to have any red flag symptoms and this appears to most likely related to her current lifestyle habits and dietary intake.  Plan:  Labs will be ordered and reviewed with her at their next office visit in two weeks. Epworth sleepiness scale score appears to be within normal limits.  Her ESS score is 7.   Tracy [name] admits to daytime somnolence and admits to waking up still tired. Patient has a history of symptoms of daytime fatigue. Tracy [name] generally gets  5-7  hours of sleep per night, and states that she has generally restful sleep. Snoring is present. Apneic episodes is not present.  ECG:  Sinus bradycardia, rate 55 bpm; reassuring without any acute abnormalities, will continue to monitor for symptoms  Modified PHQ-9 Depression Screen: Her Food and Mood (modified PHQ-9) score was 25 . In the meanwhile, Amonie will focus on self care including making healthy food choices by following their meal plan, improving sleep quality and focusing on stress reduction.  Once we are assured she is on an appropriate meal plan, we will start discussing exercise to increase cardiovascular fitness levels.    2) Shortness of breath on exertion Assessment: Condition is Not optimized. [name] does feel that she gets out of breath more easily than she used to when she exercises and seems to be worsening over time with weight gain.  This has gotten worse recently. Tracy [name] denies shortness of breath at rest or orthopnea. Tracy [name]'s shortness of breath appears to be obesity related and exercise induced, as they do not appear to have any "red flag" symptoms/ concerns today.  Also, this condition appears to be related to a state of poor cardiovascular conditioning   Plan:  Obtain labs today and will be reviewed with her at their next office visit in two weeks. Indirect Calorimeter completed today to help guide our dietary regimen. It shows a VO2 of 238 and a REE of 1642.  Her calculated basal metabolic rate is 6213 thus her measured basal metabolic rate is slightly worse than expected. Patient agreed to work on  weight loss at this time.  As [name] progresses through our weight loss program, we will gradually increase exercise as tolerated to treat her current condition.   If Tracy [name] follows our recommendations and loses 5-10% of their weight without improvement of her shortness of breath or if at any time, symptoms become more concerning, they agree to urgently follow up with their PCP/ specialist for further consideration/ evaluation.   Tracy [name] verbalizes agreement with this plan.    Other secondary hypertension Assessment:  Condition is not optimized. Last 3 blood pressure readings in our office are as follows: BP Readings from Last 3 Encounters:  01/28/23 139/83  01/24/14 158/81  - Patient endorses that she was diagnosed with Hypertension approximately 1 year ago.  - She reports good compliance and tolerance with Hyzaar 50-12.5 mg daily. Denies any side effects. -  Asymptomatic, no concerns.   Plan: - Check labs - Continue with med as recommended by specialist.  - Avoid buying foods that are: processed, frozen, or prepackaged to avoid excess salt. - Ambulatory blood pressure monitoring encouraged.  Reminded patient that if they ever feel poorly in any way, to check their blood pressure and pulse as well. - We will continue to monitor closely alongside PCP/ specialists.  Pt reminded to also f/up with those individuals as instructed by them.  - We will continue to monitor symptoms as they relate to the her weight loss journey.    Other specified hypothyroidism Assessment: Condition is not optimized. - Patient endorses that she has had Hypothyroidism since she was ~52 years old.  - Patient endorses feeling sluggish lately.  - Dr.McComb, her GYN, recently obtained labs that showed high TSH and hence increased dosage of her Thyroid Medication (NP Thyroid), but she has not obtained the higher dose medication yet. She is still taking the lower dose medication.  Plan: - Check labs.  - Patient endorses that she will pick up her higher dose thyroid medication today and she will continue treatment as recommended by Dr.McComb. - Will continue to monitor symptoms.    Other depression-Emotional Eating Assessment: Condition is improving, but not optimized. - Denies any SI/HI. Mood is stable. - Patient endorses that she eats when stressed, sad, bored, guilty, angry, as a reward, and to help comfort herself.  - Modified PHQ-9 Depression Screen: Her Food and Mood (modified PHQ-9) score was elevated at 25  Plan:  -  Check labs. - Patient was referred to Dr. Dewaine Conger, our Bariatric Psychologist, for evaluation due to her elevated PHQ-9 score and significant struggles with emotional eating.  - Importance of not skipping meals and getting all her proteins and fiber in on a daily basis discussed.   - We will continue to monitor closely alongside PCP / other specialists.   TREATMENT PLAN FOR OBESITY: Class 3 severe obesity with serious comorbidity and body mass index (BMI) of 40.0 to 44.9 in adult, unspecified obesity type [facility]) Assessment: Condition is Not optimized. Fat mass is 124.2 lbs.  Muscle mass is 110.8 lbs Total body water is 85 lbs.  Plan:  Debroh is currently in the action stage of change. As such, her goal is to continue weight management plan. Anastasya will work on healthier eating habits and begin Category 2 meal plan with only 100 snack calories.  - I extensively reviewed the Category 2 meal plan with the patient and all of her questions were addressed.   Behavioral Intervention Additional resources provided today: category 2 meal plan information Evidence-based interventions for health  behavior change were utilized today including the discussion of self monitoring techniques, problem-solving barriers and SMART goal setting techniques.   Regarding patient's less desirable eating habits and patterns, we employed the technique of small changes.  Pt will specifically work on: begin Category 2 meal plan with only 100 snack calories  for next visit.    Recommended Physical Activity Goals Talulah has been advised to work up to 150 minutes of moderate intensity aerobic activity a week and strengthening exercises 2-3 times per week for cardiovascular health, weight loss maintenance and preservation of muscle mass.  She has agreed to continue their current level of activity  FOLLOW UP: Follow up in 2 weeks. She was informed of the importance of frequent follow up visits to maximize her success with intensive  lifestyle modifications for her multiple health conditions.  Dalinda D Bigbie is aware that we will review all of her lab results at our next visit.  She is aware that if anything is critical/ life threatening with the results, we will be contacting her via MyChart prior to the office visit to discuss management.   Chief Complaint:   OBESITY Billijo D Weldon (MR# [id number]) is a 52 y.o. female who presents for evaluation and treatment of obesity and related comorbidities. Current BMI is Body mass index is 44.04 kg/m. Emelynn has been struggling with her weight for many years and has been unsuccessful in either losing weight, maintaining weight loss, or reaching her healthy weight goal.  Sharniece D Buczek is currently in the action stage of change and ready to dedicate time achieving and maintaining a healthier weight. Ettel is interested in becoming our patient and working on intensive lifestyle modifications including (but not limited to) diet and exercise for weight loss.  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 Patient is married to [place]  and has 2 children. She lives with her husband and 2 children (69 y.o son and 40 y/o daughter) .  Kaden D Elamin's habits were reviewed today and are as follows:   - She started gaining weight after high school, which she attributes to emotional eating and poor habits.  - She has tried Weight Watchers and Calorie Counting in the past and lost 100 lbs on at least 4 occasions, but regained the weight.   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   - She eats out/has fast food 3-4 times a week.  - She craves sweets, pizza, salty foods. These cravings usually occur in the afternoons.  - She snacks on salty/sweet foods.   - She drinks coffee and sweet tea (both with creamer and sugar)  - Laurette skips meals but snacks very frequently and so she does not feel hungry.  - Her worst habit is snacking and eating so much because "  I'm starting tomorrow".   Subjective:   This is the patient's first visit at [facility].  The patient's NEW PATIENT PACKET that they filled out prior to today's office visit was reviewed at length and information from that paperwork was included within the following office visit note.   Included in the packet: current and past health history, medications, allergies, ROS, gynecologic history (women only), surgical history, family history, social history, weight history, weight loss surgery history (for those that have had weight loss surgery), nutritional evaluation, mood and food questionnaire along with a depression screening (PHQ9) on all patients, an Epworth questionnaire, sleep habits  questionnaire, patient life and health improvement goals questionnaire. These will all be scanned into the patient's chart under the "media" tab.   Review of Systems: Please refer to new patient packet scanned into media. Pertinent positives were addressed with patient today.  Objective:   PHYSICAL EXAM: Blood pressure 139/83, pulse 64, temperature (!) 97.4 F (36.3 C), height 5\' 2"  (1.575 m), weight 240 lb 12.8 oz (109.2 kg), last menstrual period 01/17/2014, SpO2 98 %. Body mass index is 44.04 kg/m. General: Well Developed, well nourished, and in no acute distress.  HEENT: Normocephalic, atraumatic Skin: Warm and dry, cap RF less 2 sec, good turgor Chest:  Normal excursion, shape, no gross abn Respiratory: speaking in full sentences, no conversational dyspnea NeuroM-Sk: Ambulates w/o assistance, moves * 4 Psych: A and O *3, insight good, mood-full  Anthropometric Measurements Height: 5\' 2"  (1.575 m) Weight: 240 lb 12.8 oz (109.2 kg) BMI (Calculated): 44.03 Weight at Last Visit: N/A Weight Lost Since Last Visit: N/A Weight Gained Since Last Visit: N/A Starting Weight: 240.8 lb Peak Weight: 290 lb Waist Measurement : 48 inches   Body Composition  Body Fat %: 51.6 % Fat Mass  (lbs): 124.2 lbs Muscle Mass (lbs): 110.8 lbs Total Body Water (lbs): 85 lbs Visceral Fat Rating : 17   Other Clinical Data RMR: 1642 Fasting: YES Labs: YES Today's Visit #: #1 Starting Date: 01/28/23 Comments: FIRST VISIT    DIAGNOSTIC DATA REVIEWED:  BMET    Component Value Date/Time   NA 141 01/24/2014 1106   K 4.1 01/24/2014 1106   CL 103 01/24/2014 1106   CO2 24 01/24/2014 1106   GLUCOSE 106 (H) 01/24/2014 1106   BUN 11 01/24/2014 1106   CREATININE 0.77 01/24/2014 1106   CALCIUM 8.8 01/24/2014 1106   GFRNONAA >90 01/24/2014 1106   GFRAA >90 01/24/2014 1106   No results found for: "HGBA1C" No results found for: "INSULIN" No results found for: "TSH" CBC    Component Value Date/Time   WBC 9.4 01/24/2014 1106   RBC 4.00 01/24/2014 1106   HGB 11.8 (L) 01/24/2014 1106   HCT 35.3 (L) 01/24/2014 1106   PLT 194 01/24/2014 1106   MCV 88.3 01/24/2014 1106   MCH 29.5 01/24/2014 1106   MCHC 33.4 01/24/2014 1106   RDW 12.7 01/24/2014 1106   Iron Studies No results found for: "IRON", "TIBC", "FERRITIN", "IRONPCTSAT" Lipid Panel  No results found for: "CHOL", "TRIG", "HDL", "CHOLHDL", "VLDL", "LDLCALC", "LDLDIRECT" Hepatic Function Panel     Component Value Date/Time   PROT 7.2 01/24/2014 1106   ALBUMIN 3.7 01/24/2014 1106   AST 18 01/24/2014 1106   ALT 16 01/24/2014 1106   ALKPHOS 43 01/24/2014 1106   BILITOT 0.4 01/24/2014 1106   No results found for: "TSH" Nutritional No results found for: "VD25OH"  Attestation Statements:   Reviewed by clinician on day of visit: allergies, medications, problem list, medical history, surgical history, family history, social history, and previous encounter notes.  During the visit, I independently reviewed the patient's EKG, bioimpedance scale results, and indirect calorimeter results. I used this information to tailor a meal plan for the patient that will help Jacquiline D Chura to lose weight and will improve her  obesity-related conditions going forward.  I performed a medically necessary appropriate examination and/or evaluation. I discussed the assessment and treatment plan with the patient. The patient was provided an opportunity to ask questions and all were answered. The patient agreed with the plan and demonstrated an understanding of  the instructions. Labs were ordered today (unless patient declined them) and will be reviewed with the patient at our next visit unless more critical results need to be addressed immediately. Clinical information was updated and documented in the EMR. Time spent on visit including pre-visit chart review and post-visit care was estimated to be 60  minutes.    I,Special Puri,acting as a Neurosurgeon for [employer], DO.,have documented all relevant documentation on the behalf of Thomasene Lot, DO,as directed by  Thomasene Lot, DO while in the presence of Thomasene Lot, DO.   I, Thomasene Lot, DO, have reviewed all documentation for this visit. The documentation on 01/28/23 for the exam, diagnosis, procedures, and orders are all accurate and complete.

## 2023-01-29 LAB — CBC WITH DIFFERENTIAL/PLATELET
Basophils Absolute: 0.1 10*3/uL (ref 0.0–0.2)
Basos: 1 %
EOS (ABSOLUTE): 0.3 10*3/uL (ref 0.0–0.4)
Eos: 5 %
Hematocrit: 35.4 % (ref 34.0–46.6)
Hemoglobin: 11.3 g/dL (ref 11.1–15.9)
Immature Grans (Abs): 0 10*3/uL (ref 0.0–0.1)
Immature Granulocytes: 1 %
Lymphocytes Absolute: 1.7 10*3/uL (ref 0.7–3.1)
Lymphs: 27 %
MCH: 29.2 pg (ref 26.6–33.0)
MCHC: 31.9 g/dL (ref 31.5–35.7)
MCV: 92 fL (ref 79–97)
Monocytes Absolute: 0.5 10*3/uL (ref 0.1–0.9)
Monocytes: 7 %
Neutrophils Absolute: 3.8 10*3/uL (ref 1.4–7.0)
Neutrophils: 59 %
Platelets: 340 10*3/uL (ref 150–450)
RBC: 3.87 x10E6/uL (ref 3.77–5.28)
RDW: 12.5 % (ref 11.7–15.4)
WBC: 6.4 10*3/uL (ref 3.4–10.8)

## 2023-01-29 LAB — COMPREHENSIVE METABOLIC PANEL
ALT: 17 IU/L (ref 0–32)
AST: 18 IU/L (ref 0–40)
Albumin/Globulin Ratio: 1.7 (ref 1.2–2.2)
Albumin: 4.3 g/dL (ref 3.9–4.9)
Alkaline Phosphatase: 61 IU/L (ref 44–121)
BUN/Creatinine Ratio: 28 — ABNORMAL HIGH (ref 9–23)
BUN: 22 mg/dL (ref 6–24)
Bilirubin Total: <0.2 mg/dL (ref 0.0–1.2)
CO2: 23 mmol/L (ref 20–29)
Calcium: 9.5 mg/dL (ref 8.7–10.2)
Chloride: 102 mmol/L (ref 96–106)
Creatinine, Ser: 0.79 mg/dL (ref 0.57–1.00)
Globulin, Total: 2.6 g/dL (ref 1.5–4.5)
Glucose: 87 mg/dL (ref 70–99)
Potassium: 4.5 mmol/L (ref 3.5–5.2)
Sodium: 141 mmol/L (ref 134–144)
Total Protein: 6.9 g/dL (ref 6.0–8.5)
eGFR: 91 mL/min/{1.73_m2} (ref >59)

## 2023-01-29 LAB — INSULIN, RANDOM: INSULIN: 9 u[IU]/mL (ref 2.6–24.9)

## 2023-01-29 LAB — FOLATE: Folate: 11.2 ng/mL (ref >3.0)

## 2023-01-29 LAB — T4, FREE: Free T4: 0.83 ng/dL (ref 0.82–1.77)

## 2023-01-29 LAB — VITAMIN D 25 HYDROXY (VIT D DEFICIENCY, FRACTURES): Vit D, 25-Hydroxy: 39.2 ng/mL (ref 30.0–100.0)

## 2023-01-29 LAB — LIPID PANEL
Chol/HDL Ratio: 3.3 ratio (ref 0.0–4.4)
Cholesterol, Total: 192 mg/dL (ref 100–199)
HDL: 59 mg/dL (ref >39)
LDL Chol Calc (NIH): 115 mg/dL — ABNORMAL HIGH (ref 0–99)
Triglycerides: 98 mg/dL (ref 0–149)
VLDL Cholesterol Cal: 18 mg/dL (ref 5–40)

## 2023-01-29 LAB — HEMOGLOBIN A1C
Est. average glucose Bld gHb Est-mCnc: 120 mg/dL
Hgb A1c MFr Bld: 5.8 % — ABNORMAL HIGH (ref 4.8–5.6)

## 2023-01-29 LAB — T3: T3, Total: 160 ng/dL (ref 71–180)

## 2023-01-29 LAB — TSH: TSH: 8.5 u[IU]/mL — ABNORMAL HIGH (ref 0.450–4.500)

## 2023-01-29 LAB — VITAMIN B12: Vitamin B-12: 804 pg/mL (ref 232–1245)
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
## 2023-02-11 ENCOUNTER — Ambulatory Visit (INDEPENDENT_AMBULATORY_CARE_PROVIDER_SITE_OTHER): Payer: BC Managed Care – PPO | Admitting: Family Medicine

## 2023-02-11 ENCOUNTER — Encounter (INDEPENDENT_AMBULATORY_CARE_PROVIDER_SITE_OTHER): Payer: Self-pay | Admitting: Family Medicine

## 2023-02-11 VITALS — BP 130/83 | HR 66 | Temp 97.9°F | Ht 62.0 in | Wt 238.0 lb

## 2023-02-11 DIAGNOSIS — Z6841 Body Mass Index (BMI) 40.0 and over, adult: Secondary | ICD-10-CM

## 2023-02-11 DIAGNOSIS — E7849 Other hyperlipidemia: Secondary | ICD-10-CM | POA: Diagnosis not present

## 2023-02-11 DIAGNOSIS — E559 Vitamin D deficiency, unspecified: Secondary | ICD-10-CM

## 2023-02-11 DIAGNOSIS — R7303 Prediabetes: Secondary | ICD-10-CM | POA: Diagnosis not present

## 2023-02-11 DIAGNOSIS — E038 Other specified hypothyroidism: Secondary | ICD-10-CM | POA: Diagnosis not present

## 2023-02-11 DIAGNOSIS — I158 Other secondary hypertension: Secondary | ICD-10-CM

## 2023-02-11 MED ORDER — VITAMIN D (ERGOCALCIFEROL) 1.25 MG (50000 UNIT) PO CAPS
50000.0000 [IU] | ORAL_CAPSULE | ORAL | 0 refills | Status: DC
Start: 2023-02-11 — End: 2023-03-09

## 2023-02-11 NOTE — Progress Notes (Signed)
Tracy [name], D.O.  ABFM, ABOM Specializing in Clinical Bariatric Medicine  Office located at: [address] [place], [place]  [address]     Assessment and Plan:   Meds ordered this encounter  Medications   Vitamin D, Ergocalciferol, (DRISDOL) 1.25 MG (50000 UNIT) CAPS capsule    Sig: Take 1 capsule (50,000 Units total) by mouth every 7 (seven) days.    Dispense:  4 capsule    Refill:  0     Prediabetes  Assessment: Condition is new and not optimized. Labs were reviewed.  Lab Results  Component Value Date   HGBA1C 5.8 (H) 01/28/2023   INSULIN 9.0 01/28/2023   Lab Results  Component Value Date   WBC 6.4 01/28/2023   HGB 11.3 01/28/2023   HCT 35.4 01/28/2023   MCV 92 01/28/2023   PLT 340 01/28/2023   Lab Results  Component Value Date   CREATININE 0.79 01/28/2023   BUN 22 01/28/2023   NA 141 01/28/2023   K 4.5 01/28/2023   CL 102 01/28/2023   CO2 23 01/28/2023      Component Value Date/Time   PROT 6.9 01/28/2023 0955   ALBUMIN 4.3 01/28/2023 0955   AST 18 01/28/2023 0955   ALT 17 01/28/2023 0955   ALKPHOS 61 01/28/2023 0955   BILITOT <0.2 01/28/2023 0955   Lab Results  Component Value Date   FOLATE 11.2 01/28/2023   Lab Results  Component Value Date   VITAMINB12 804 01/28/2023  Labs indicate that: - Her A1c levels are within the prediabetic range (5.7-6.4) - Her Insulin levels are almost two times the normal value.  - Her blood counts are within normal limits and do not indicate anemia. Good compliance and tolerance with Iron 325 mg daily. Denies any side effects. - Her kidney and liver function are stable. However, labs indicate that she is dehydrated.  - Folate levels are within normal limits. - Her B12 levels are within the normal limits of 500+.  Plan:  - Continue with Iron 325 mg daily as recommended by pcp/specialist.  -  Unless pre-existing renal or cardiopulmonary conditions exist which patient was told to limit their fluid intake  by another provider, I recommended roughly one half of their weight in pounds, to be the approximate ounces of non-caloric, non-caffeinated beverages they should drink per day; including more if they are engaging in exercise.  - I counseled patient on pathophysiology of the disease process of Pre-DM.   - Stressed importance of dietary and lifestyle modifications to result in weight loss as first line txmnt - Continue to decrease simple carbs/ sugars; increase fiber and proteins -> follow her meal plan.   - Explained role of simple carbs and insulin levels on hunger and cravings - Handouts provided at pt's request after education provided.  All concerns/questions addressed.   - Tracy [name] will continue to work on weight loss, exercise, via their meal plan we devised to help decrease the risk of progressing to diabetes.  - We will recheck A1c and fasting insulin level in approximately 3 months from last check, or as deemed appropriate.    Vitamin D deficiency  Assessment: Condition is new and  not at goal. Labs were reviewed.  Lab Results  Component Value Date   VD25OH 39.2 01/28/2023  - Labs indicate that her Vitamin D levels are not within the normal limits of 50-80.  - She is currently not on any Vitamin D medication. This is diet controlled.  Plan: - Begin Ergocalciferol 50K IU weekly.  - I discussed the importance of vitamin D to the patient's health and well-being as well as to their ability to lose weight.  - I reviewed possible symptoms of low Vitamin D:  low energy, depressed mood, muscle aches, joint aches, osteoporosis etc. with patient - It has been show that administration of vitamin D supplementation leads to improved satiety and a decrease in inflammatory markers.  Hence, low Vitamin D levels may be linked to an increased risk of cardiovascular events and even increased risk of cancers- such as colon and breast. - ideal vitamin D levels reviewed with patient   - Informed patient this  may be a lifelong thing, and she was encouraged to continue to take the medicine until told otherwise.    - weight loss will likely improve availability of vitamin D, thus encouraged Tracy [name] to continue with meal plan and their weight loss efforts to further improve this condition.  Thus, we will need to monitor levels regularly (every 3-4 mo on average) to keep levels within normal limits and prevent over supplementation. - pt's questions and concerns regarding this condition addressed.     Other specified hypothyroidism Assessment: Condition is not optimized. Labs were reviewed.  Lab Results  Component Value Date   TSH 8.500 (H) 01/28/2023   FREET4 0.83 01/28/2023  - Labs indicate that her FREET4 is close to being too low, which is likely why her TSH has increased.  - She reports good compliance and tolerance with NP Thyroid 120 mg daily. Denies any side effects.  Plan: - Continue with thyroid medication and treatment as recommended by endocrinologist - Will continue to monitor as it relates to her weight loss journey.    Other Hyperlipidemia Assessment: Condition is new not optimized. Labs were reviewed.  Lab Results  Component Value Date   CHOL 192 01/28/2023   HDL 59 01/28/2023   LDLCALC 115 (H) 01/28/2023   TRIG 98 01/28/2023   CHOLHDL 3.3 01/28/2023   The 10-year ASCVD risk score (Arnett DK, et al., 2019) is: 1.5%   Values used to calculate the score:     Age: 52 years     Sex: Female     Is Non-Hispanic African American: No     Diabetic: No     Tobacco smoker: No     Systolic Blood Pressure: 130 mmHg     Is BP treated: Yes     HDL Cholesterol: 59 mg/dL     Total Cholesterol: 192 mg/dL   Labs indicate that:  - Her LDL levels are slightly elevated and above the normal limits of 0-99 mg/dl.   Plan:  -  I don't believe that she needs to be on any cholesterol medication at the moment because her ASCVD risk score is less than 5%  - I stressed the importance that patient  continue with our prudent nutritional plan that is low in saturated and trans fats, and low in fatty carbs to improve these numbers.  - We will continue routine screening as patient continues to achieve health goals along their weight loss journey     TREATMENT PLAN FOR OBESITY: Class 3 severe obesity with serious comorbidity and body mass index (BMI) of 40.0 to 44.9 in adult, unspecified obesity type [facility]) Assessment:  Jeannelle D Wrzesinski is here to discuss her progress with her obesity treatment plan along with follow-up of her obesity related diagnoses. See Medical Weight Management Flowsheet for complete bioelectrical impedance results.  Condition is improving, but not optimized. Biometric data collected today, was reviewed with patient.   Since last office visit patient's Fat mass has decreased by 1.2 lb. Muscle mass has decreased by 1.2 lb. Total body water has decreased by 0.8 lb.  Counseling done on how various foods will affect these numbers and how to maximize success  Total lbs lost to date: 2  Total weight loss percentage to date: 0.83   Plan:  Luv is currently in the action stage of change. As such, her goal is to continue her weight management plan. Tracy [name] will work on healthier eating habits and continue the Category 2 meal plan with breakfast options and only 100 snack calories and begin journaling 1150-1250 calories and 80+ grams of protein daily.  - Discussed different healthy meals that she can have for breakfast. For example, Red's Egg Which [place] Sausage.  - Reviewed the crock pot recipes handout with pt. - Discussed different dressings that she can incorporate into her lunch sandwiches.   Behavioral Intervention Additional resources provided today: Food journaling plan information, breakfast options, and crockpot recipes handout , I.R/Prediabetes Handout Evidence-based interventions for health behavior change were utilized today including the discussion of self monitoring  techniques, problem-solving barriers and SMART goal setting techniques.   Regarding patient's less desirable eating habits and patterns, we employed the technique of small changes.  Pt will specifically work on: increase water intake, focusing on nailing dinner, and begin journaling intake for next visit.    Recommended Physical Activity Goals  Tracy [name] has been advised to slowly work up to 150 minutes of moderate intensity aerobic activity a week and strengthening exercises 2-3 times per week for cardiovascular health, weight loss maintenance and preservation of muscle mass.   She has agreed to Continue current level of physical activity   FOLLOW UP: Return in about 2 weeks (around 02/25/2023). She was informed of the importance of frequent follow up visits to maximize her success with intensive lifestyle modifications for her multiple health conditions.  Subjective:   Chief complaint: Obesity Arrionna is here to discuss her progress with her obesity treatment plan. She is on the the Category 2 Plan with only 100 snack calories and states she is following her eating plan approximately 60 % of the time. She states she is not exercising.   Interval History:  Tracy [name] is here today for her first follow-up office visit since starting the program with [place].    Since last office visit she:  - Reports that it was difficult to follow the meal plan on days that she did not meal prep.  - Endorses that it was easiest to follow the meal plan at breakfast and the hardest at dinner when she returned tired from work.  - For lunch, she usually has a sandwich with [place] and lettuce.  - For snacks, she had a  60 calorie Fruit Bars  - Pt expresses interest in journaling her intake.  - She has an upcoming appointment with Dr.Barker at around  02/16/23.   All blood work/ lab tests that were recently ordered by myself or an outside provider were reviewed with patient today per their request. Extended time was  spent counseling her on all new disease processes that were discovered or preexisting ones that are affected by BMI.  she understands that many of these abnormalities will need to monitored regularly along with the current treatment plan of prudent dietary changes, in which we are making each and every office  visit, to improve these health parameters.  Review of Systems:  Pertinent positives were addressed with patient today.  Weight Summary and Biometrics   Weight Lost Since Last Visit: 2 lb  Weight Gained Since Last Visit: 0   Vitals Temp: 97.9 F (36.6 C) BP: 130/83 Pulse Rate: 66 SpO2: 98 %   Anthropometric Measurements Height: 5\' 2"  (1.575 m) Weight: 238 lb (108 kg) BMI (Calculated): 43.52 Weight at Last Visit: 240 lb Weight Lost Since Last Visit: 2 lb Weight Gained Since Last Visit: 0 Starting Weight: 240.8 lb Total Weight Loss (lbs): 2 lb (0.907 kg) Peak Weight: 290 lb   Body Composition  Body Fat %: 51.5 % Fat Mass (lbs): 123 lbs Muscle Mass (lbs): 110 lbs Total Body Water (lbs): 84.2 lbs Visceral Fat Rating : 16   Other Clinical Data Fasting: No Labs: No Today's Visit #: 2 Starting Date: 01/28/23   Objective:   PHYSICAL EXAM:  Blood pressure 130/83, pulse 66, temperature 97.9 F (36.6 C), height 5\' 2"  (1.575 m), weight 238 lb (108 kg), last menstrual period 01/17/2014, SpO2 98 %. Body mass index is 43.53 kg/m.  General: Well Developed, well nourished, and in no acute distress.  HEENT: Normocephalic, atraumatic Skin: Warm and dry, cap RF less 2 sec, good turgor Chest:  Normal excursion, shape, no gross abn Respiratory: speaking in full sentences, no conversational dyspnea NeuroM-Sk: Ambulates w/o assistance, moves * 4 Psych: A and O *3, insight good, mood-full  DIAGNOSTIC DATA REVIEWED:  BMET    Component Value Date/Time   NA 141 01/28/2023 0955   K 4.5 01/28/2023 0955   CL 102 01/28/2023 0955   CO2 23 01/28/2023 0955   GLUCOSE 87  01/28/2023 0955   GLUCOSE 106 (H) 01/24/2014 1106   BUN 22 01/28/2023 0955   CREATININE 0.79 01/28/2023 0955   CALCIUM 9.5 01/28/2023 0955   GFRNONAA >90 01/24/2014 1106   GFRAA >90 01/24/2014 1106   Lab Results  Component Value Date   HGBA1C 5.8 (H) 01/28/2023   Lab Results  Component Value Date   INSULIN 9.0 01/28/2023   Lab Results  Component Value Date   TSH 8.500 (H) 01/28/2023   CBC    Component Value Date/Time   WBC 6.4 01/28/2023 0955   WBC 9.4 01/24/2014 1106   RBC 3.87 01/28/2023 0955   RBC 4.00 01/24/2014 1106   HGB 11.3 01/28/2023 0955   HCT 35.4 01/28/2023 0955   PLT 340 01/28/2023 0955   MCV 92 01/28/2023 0955   MCH 29.2 01/28/2023 0955   MCH 29.5 01/24/2014 1106   MCHC 31.9 01/28/2023 0955   MCHC 33.4 01/24/2014 1106   RDW 12.5 01/28/2023 0955   Iron Studies No results found for: "IRON", "TIBC", "FERRITIN", "IRONPCTSAT" Lipid Panel     Component Value Date/Time   CHOL 192 01/28/2023 0955   TRIG 98 01/28/2023 0955   HDL 59 01/28/2023 0955   CHOLHDL 3.3 01/28/2023 0955   LDLCALC 115 (H) 01/28/2023 0955   Hepatic Function Panel     Component Value Date/Time   PROT 6.9 01/28/2023 0955   ALBUMIN 4.3 01/28/2023 0955   AST 18 01/28/2023 0955   ALT 17 01/28/2023 0955   ALKPHOS 61 01/28/2023 0955   BILITOT <0.2 01/28/2023 0955      Component Value Date/Time   TSH 8.500 (H) 01/28/2023 0955   Nutritional Lab Results  Component Value Date   VD25OH 39.2 01/28/2023    Attestations:   Reviewed by clinician on day  of visit: allergies, medications, problem list, medical history, surgical history, family history, social history, and previous encounter notes.   Patient was in the office today and time spent on visit including pre-visit chart review and post-visit care/coordination of care and electronic medical record documentation was 44 minutes. 50% of the time was in face to face counseling of this patient's medical condition(s) and providing  education on treatment options to include the first-line treatment of diet and lifestyle modification.   I,Special Puri,acting as a Neurosurgeon for [employer], DO.,have documented all relevant documentation on the behalf of Tracy Lot, DO,as directed by  Tracy Lot, DO while in the presence of Tracy Lot, DO.   I, Tracy Lot, DO, have reviewed all documentation for this visit. The documentation on 02/11/23 for the exam, diagnosis, procedures, and orders are all accurate and complete.

## 2023-02-11 NOTE — Patient Instructions (Signed)
The 10-year ASCVD risk score (Arnett DK, et al., 2019) is: 1.5%   Values used to calculate the score:     Age: 52 years     Sex: Female     Is Non-Hispanic African American: No     Diabetic: No     Tobacco smoker: No     Systolic Blood Pressure: 130 mmHg     Is BP treated: Yes     HDL Cholesterol: 59 mg/dL     Total Cholesterol: 192 mg/dL

## 2023-02-13 DIAGNOSIS — F411 Generalized anxiety disorder: Secondary | ICD-10-CM | POA: Diagnosis not present

## 2023-02-16 ENCOUNTER — Telehealth (INDEPENDENT_AMBULATORY_CARE_PROVIDER_SITE_OTHER): Payer: BC Managed Care – PPO | Admitting: Psychology

## 2023-02-16 DIAGNOSIS — F5089 Other specified eating disorder: Secondary | ICD-10-CM

## 2023-03-09 ENCOUNTER — Ambulatory Visit (INDEPENDENT_AMBULATORY_CARE_PROVIDER_SITE_OTHER): Payer: BC Managed Care – PPO | Admitting: Family Medicine

## 2023-03-09 ENCOUNTER — Encounter (INDEPENDENT_AMBULATORY_CARE_PROVIDER_SITE_OTHER): Payer: Self-pay | Admitting: Family Medicine

## 2023-03-09 VITALS — BP 137/80 | HR 67 | Temp 98.0°F | Ht 62.0 in | Wt 230.0 lb

## 2023-03-09 DIAGNOSIS — E559 Vitamin D deficiency, unspecified: Secondary | ICD-10-CM

## 2023-03-09 DIAGNOSIS — R7303 Prediabetes: Secondary | ICD-10-CM

## 2023-03-09 DIAGNOSIS — Z6841 Body Mass Index (BMI) 40.0 and over, adult: Secondary | ICD-10-CM | POA: Diagnosis not present

## 2023-03-09 DIAGNOSIS — F411 Generalized anxiety disorder: Secondary | ICD-10-CM | POA: Diagnosis not present

## 2023-03-09 MED ORDER — VITAMIN D (ERGOCALCIFEROL) 1.25 MG (50000 UNIT) PO CAPS
50000.0000 [IU] | ORAL_CAPSULE | ORAL | 0 refills | Status: DC
Start: 2023-03-09 — End: 2023-03-23

## 2023-03-09 NOTE — Progress Notes (Signed)
Tracy [name], D.O.  ABFM, ABOM Specializing in Clinical Bariatric Medicine  Office located at: [address] [place], [place]  [address]     Assessment and Plan:   Medications Discontinued During This Encounter  Medication Reason   progesterone (PROMETRIUM) 100 MG capsule Patient Preference   Vitamin D, Ergocalciferol, (DRISDOL) 1.25 MG (50000 UNIT) CAPS capsule Reorder     Meds ordered this encounter  Medications   Vitamin D, Ergocalciferol, (DRISDOL) 1.25 MG (50000 UNIT) CAPS capsule    Sig: Take 1 capsule (50,000 Units total) by mouth every 7 (seven) days.    Dispense:  4 capsule    Refill:  0     Prediabetes Assessment: Condition is diet controlled and not optimized. Her hunger and cravings are controlled when following the prudent nutritional plan.   Lab Results  Component Value Date   HGBA1C 5.8 (H) 01/28/2023   INSULIN 9.0 01/28/2023    Plan: Reminded patient that having protein with each meal is important for controlling hunger and cravings and increasing metabolism. Tracy [name] will continue to work on weight loss, exercise, via their meal plan we devised to help decrease the risk of progressing to diabetes. We will recheck A1c and fasting insulin level in approximately 3 months from last check, or as deemed appropriate.     Vitamin D deficiency Assessment: Condition is not optimized. No issues with Ergocalciferol 50K IU weekly. Denies any N/V/D.   Lab Results  Component Value Date   VD25OH 39.2 01/28/2023   Plan: Continue with OTC supplement. Will reorder this today.  Will continue to monitor levels regularly (every 3-4 mo on average) to keep levels within normal limits and prevent over supplementation.   TREATMENT PLAN FOR OBESITY: Class 3 severe obesity with serious comorbidity and body mass index (BMI) of 40.0 to 44.9 in adult, unspecified obesity type [facility]) Assessment: Tracy [name] is here to discuss her progress with her obesity treatment plan along  with follow-up of her obesity related diagnoses. See Medical Weight Management Flowsheet for complete bioelectrical impedance results.  Condition is not optimized. Biometric data collected today, was reviewed with patient.   Since last office visit on 02/11/23 patient's  Muscle mass has decreased by 4.2 lb. Fat mass has decreased by 4.4 lb. Total body water has increased by 1 lb.  Counseling done on how various foods will affect these numbers and how to maximize success  Total lbs lost to date: 10.8 Total weight loss percentage to date: 4.49    Plan: Continue the Category 2 meal plan with breakfast options and only 100 snack calories and  journaling 1150-1250 calories and 80+ grams of protein daily.   - I again reviewed portions of the meal plan with the pt and showed her how to reach her goal of 80+ grams protein more consistently.   - Reminded patient to choose leaner meats ( for example: ground [place], chicken. Canned tuna).   - Recommended pt to try foods like liquid egg whites to increase her protein intake.    Behavioral Intervention Additional resources provided today: Protein Content of Foods Handout, Food journaling log, Healthy [employer] Evidence-based interventions for health behavior change were utilized today including the discussion of self monitoring techniques, problem-solving barriers and SMART goal setting techniques.   Regarding patient's less desirable eating habits and patterns, we employed the technique of small changes.  Pt will specifically work on: increasing her lean protein intake and journaling her intake/bringing food log  for  next visit.    Recommended Physical Activity Goals  Tracy [name] has been advised to slowly work up to 150 minutes of moderate intensity aerobic activity a week and strengthening exercises 2-3 times per week for cardiovascular health, weight loss maintenance and preservation of muscle mass.   She has agreed to Continue current level of  physical activity    FOLLOW UP: Return in about 2 weeks (around 03/23/2023). She was informed of the importance of frequent follow up visits to maximize her success with intensive lifestyle modifications for her multiple health conditions.   Subjective:   Chief complaint: Obesity Tracy [name] is here to discuss her progress with her obesity treatment plan. She is on the the Category 2 Plan and keeping a food journal and adhering to recommended goals of 1150-1250 calories and 80+ protein and states she is following her eating plan approximately 70% of the time. She states she is not exercising,   Interval History:  Tracy [name] is here for a follow up office visit.     Since last office visit:    - Apart from 2-3 days, Tracy [name] mostly followed the meal plan.   - Her journaling log indicates that on a few occasions, she reached her goal of 80+ grams of protein. She reports that it is challenging to eat this quantity of protein day after day.   - She is not having any challenges with hunger and/or cravings when following the prescribed meal plan.   Pharmacotherapy Current Anti-obesity medications: None Reported side effects: None   Review of Systems:  Pertinent positives were addressed with patient today.  Weight Summary and Biometrics   Weight Lost Since Last Visit: 8lb  Weight Gained Since Last Visit: 0    Vitals Temp: 98 F (36.7 C) BP: 137/80 Pulse Rate: 67 SpO2: 99 %   Anthropometric Measurements Height: 5\' 2"  (1.575 m) Weight: 230 lb (104.3 kg) BMI (Calculated): 42.06 Weight at Last Visit: 238b Weight Lost Since Last Visit: 8lb Weight Gained Since Last Visit: 0 Starting Weight: 240.8 Total Weight Loss (lbs): 10 lb (4.536 kg) Peak Weight: 290lb   Body Composition  Body Fat %: 51.6 % Fat Mass (lbs): 118.6 lbs Muscle Mass (lbs): 105.8 lbs Total Body Water (lbs): 85.2 lbs Visceral Fat Rating : 16   Other Clinical Data Fasting: no Labs: no Today's Visit #:  3 Starting Date: 01/28/23   Objective:   PHYSICAL EXAM: Blood pressure 137/80, pulse 67, temperature 98 F (36.7 C), height 5\' 2"  (1.575 m), weight 230 lb (104.3 kg), last menstrual period 01/17/2014, SpO2 99 %. Body mass index is 42.07 kg/m.  General: Well Developed, well nourished, and in no acute distress.  HEENT: Normocephalic, atraumatic Skin: Warm and dry, cap RF less 2 sec, good turgor Chest:  Normal excursion, shape, no gross abn Respiratory: speaking in full sentences, no conversational dyspnea NeuroM-Sk: Ambulates w/o assistance, moves * 4 Psych: A and O *3, insight good, mood-full  DIAGNOSTIC DATA REVIEWED:  BMET    Component Value Date/Time   NA 141 01/28/2023 0955   K 4.5 01/28/2023 0955   CL 102 01/28/2023 0955   CO2 23 01/28/2023 0955   GLUCOSE 87 01/28/2023 0955   GLUCOSE 106 (H) 01/24/2014 1106   BUN 22 01/28/2023 0955   CREATININE 0.79 01/28/2023 0955   CALCIUM 9.5 01/28/2023 0955   GFRNONAA >90 01/24/2014 1106   GFRAA >90 01/24/2014 1106   Lab Results  Component Value Date   HGBA1C 5.8 (H) 01/28/2023  Lab Results  Component Value Date   INSULIN 9.0 01/28/2023   Lab Results  Component Value Date   TSH 8.500 (H) 01/28/2023   CBC    Component Value Date/Time   WBC 6.4 01/28/2023 0955   WBC 9.4 01/24/2014 1106   RBC 3.87 01/28/2023 0955   RBC 4.00 01/24/2014 1106   HGB 11.3 01/28/2023 0955   HCT 35.4 01/28/2023 0955   PLT 340 01/28/2023 0955   MCV 92 01/28/2023 0955   MCH 29.2 01/28/2023 0955   MCH 29.5 01/24/2014 1106   MCHC 31.9 01/28/2023 0955   MCHC 33.4 01/24/2014 1106   RDW 12.5 01/28/2023 0955   Iron Studies No results found for: "IRON", "TIBC", "FERRITIN", "IRONPCTSAT" Lipid Panel     Component Value Date/Time   CHOL 192 01/28/2023 0955   TRIG 98 01/28/2023 0955   HDL 59 01/28/2023 0955   CHOLHDL 3.3 01/28/2023 0955   LDLCALC 115 (H) 01/28/2023 0955   Hepatic Function Panel     Component Value Date/Time   PROT 6.9  01/28/2023 0955   ALBUMIN 4.3 01/28/2023 0955   AST 18 01/28/2023 0955   ALT 17 01/28/2023 0955   ALKPHOS 61 01/28/2023 0955   BILITOT <0.2 01/28/2023 0955      Component Value Date/Time   TSH 8.500 (H) 01/28/2023 0955   Nutritional Lab Results  Component Value Date   VD25OH 39.2 01/28/2023    Attestations:   Reviewed by clinician on day of visit: allergies, medications, problem list, medical history, surgical history, family history, social history, and previous encounter notes.    I,Special Puri,acting as a Neurosurgeon for [employer], DO.,have documented all relevant documentation on the behalf of Tracy Lot, DO,as directed by  Tracy Lot, DO while in the presence of Tracy Lot, DO.   I, Tracy Lot, DO, have reviewed all documentation for this visit. The documentation on 03/09/23 for the exam, diagnosis, procedures, and orders are all accurate and complete.

## 2023-03-23 ENCOUNTER — Ambulatory Visit (INDEPENDENT_AMBULATORY_CARE_PROVIDER_SITE_OTHER): Payer: BC Managed Care – PPO | Admitting: Family Medicine

## 2023-03-23 ENCOUNTER — Encounter (INDEPENDENT_AMBULATORY_CARE_PROVIDER_SITE_OTHER): Payer: Self-pay | Admitting: Family Medicine

## 2023-03-23 ENCOUNTER — Telehealth (INDEPENDENT_AMBULATORY_CARE_PROVIDER_SITE_OTHER): Payer: BC Managed Care – PPO | Admitting: Psychology

## 2023-03-23 VITALS — BP 123/84 | HR 63 | Temp 97.9°F | Ht 62.0 in | Wt 226.0 lb

## 2023-03-23 DIAGNOSIS — F5089 Other specified eating disorder: Secondary | ICD-10-CM

## 2023-03-23 DIAGNOSIS — E559 Vitamin D deficiency, unspecified: Secondary | ICD-10-CM

## 2023-03-23 DIAGNOSIS — I158 Other secondary hypertension: Secondary | ICD-10-CM | POA: Diagnosis not present

## 2023-03-23 DIAGNOSIS — R7303 Prediabetes: Secondary | ICD-10-CM | POA: Diagnosis not present

## 2023-03-23 DIAGNOSIS — Z6841 Body Mass Index (BMI) 40.0 and over, adult: Secondary | ICD-10-CM

## 2023-03-23 MED ORDER — VITAMIN D (ERGOCALCIFEROL) 1.25 MG (50000 UNIT) PO CAPS
50000.0000 [IU] | ORAL_CAPSULE | ORAL | 0 refills | Status: AC
Start: 2023-03-23 — End: ?

## 2023-03-23 NOTE — Progress Notes (Signed)
  Office: [phone]  /  Fax: [phone]    Date: March 23, 2023  Appointment Start Time: 8:32am Duration: 23 minutes Provider: Lawerance Cruel, Psy.D. Type of Session: Individual Therapy  Location of Patient: Home (private location) Location of Provider: Provider's Home (private office) Type of Contact: Telepsychological Visit via MyChart Video Visit  Session Content: Tracy [name] is a 52 y.o. female presenting for a follow-up appointment to address the previously established treatment goal of increasing coping skills.Today's appointment was a telepsychological visit. Tracy [name] provided verbal consent for today's telepsychological appointment and she is aware she is responsible for securing confidentiality on her end of the session. Prior to proceeding with today's appointment, Tracy [name]'s physical location at the time of this appointment was obtained as well a phone number she could be reached at in the event of technical difficulties. Tracy [name] and this provider participated in today's telepsychological service.   This provider conducted a brief check-in. Tracy [name] reported challenges with her eating habits while attending a camp last week. Since the last appointment with this provider, she denied engagement in binge eating behaviors, restricting food intake, and laxative use. Psychoeducation regarding triggers for emotional eating was provided. Tracy [name] was provided a handout, and encouraged to utilize the handout between now and the next appointment to increase awareness of triggers and frequency. Tracy [name] agreed. This provider also discussed behavioral strategies for specific triggers, such as placing the utensil down when conversing to avoid mindless eating. Tracy [name] provided verbal consent during today's appointment for this provider to send a handout about triggers via e-mail. Overall, Tracy [name] was receptive to today's appointment as evidenced by openness to sharing, responsiveness to feedback, and willingness to explore  triggers for emotional eating.  Mental Status Examination:  Appearance: neat Behavior: appropriate to circumstances Mood: neutral Affect: mood congruent Speech: WNL Eye Contact: appropriate Psychomotor Activity: WNL Gait: unable to assess Thought Process: linear, logical, and goal directed and no evidence or endorsement of suicidal, homicidal, and self-harm ideation, plan and intent  Thought Content/Perception: no hallucinations, delusions, bizarre thinking or behavior endorsed or observed Orientation: AAOx4 Memory/Concentration: memory, attention, language, and fund of knowledge intact  Insight: fair Judgment: fair  Interventions:  Conducted a brief chart review Provided empathic reflections and validation Provided positive reinforcement Employed supportive psychotherapy interventions to facilitate reduced distress and to improve coping skills with identified stressors Psychoeducation provided regarding triggers for emotional eating behaviors  DSM-5 Diagnosis(es):  F50.89 Other Specified Feeding or Eating Disorder, Emotional and Binge Eating Behaviors  Treatment Goal & Progress: During the initial appointment with this provider, the following treatment goal was established: increase coping skills. Progress is limited, as Tracy [name] has just begun treatment with this provider; however, she is receptive to the interaction and interventions and rapport is being established.   Plan: The next appointment is scheduled for 04/06/2023 at 11am, which will be via MyChart Video Visit. The next session will focus on working towards the established treatment goal.

## 2023-03-23 NOTE — Progress Notes (Signed)
Tracy [name], D.O.  ABFM, ABOM Specializing in Clinical Bariatric Medicine  Office located at: [address] [place], [place]  [address]     Assessment and Plan:   Medications Discontinued During This Encounter  Medication Reason   LYLLANA 0.05 MG/24HR patch Patient Preference   Vitamin D, Ergocalciferol, (DRISDOL) 1.25 MG (50000 UNIT) CAPS capsule Reorder     Meds ordered this encounter  Medications   Vitamin D, Ergocalciferol, (DRISDOL) 1.25 MG (50000 UNIT) CAPS capsule    Sig: Take 1 capsule (50,000 Units total) by mouth every 7 (seven) days.    Dispense:  4 capsule    Refill:  0     Prediabetes Assessment: Condition is diet/exercise controlled; pt is not taking any prediabetic medication. She endorses that her hunger and cravings are well controlled when following the meal plan.   Lab Results  Component Value Date   HGBA1C 5.8 (H) 01/28/2023   INSULIN 9.0 01/28/2023    Plan: We briefly discussed Metformin,  which can help [place] in the management of this disease process as well as with weight loss.  Will consider starting this medication in future as we will focus on prudent nutritional plan at this time.   Continue to decrease simple carbs/ sugars; increase fiber and proteins -> follow her meal plan.  Will continue to monitor condition closely alongside PCP.    Other secondary hypertension Assessment: Her blood pressure is stable. No concerns in this regard today. She has been compliant and tolerant with Hyzaar 50-12.5 mg daily. Denies any side effects.  Last 3 blood pressure readings in our office are as follows: BP Readings from Last 3 Encounters:  03/23/23 123/84  03/09/23 137/80  02/11/23 130/83   Plan: Continue with antihypertensive medication as recommended by Specialist.   Continue with Prudent nutritional plan and low sodium diet, advance exercise as tolerated.  Ambulatory blood pressure monitoring encouraged.  Reminded patient that if they ever  feel poorly in any way, to check their blood pressure and pulse as well.We will continue to monitor closely alongside PCP/ specialists.  Pt reminded to also f/up with those individuals as instructed by them. We will continue to monitor symptoms as they relate to the her weight loss journey.    Vitamin D deficiency Assessment: Condition is not optimized is being treated with Ergocalciferol 50K IU weekly. Pt endorses not seeing an improvement in her energy levels yet.   Lab Results  Component Value Date   VD25OH 39.2 01/28/2023   Plan: Continue with prescribed supplement at current dose. Will refill ERGO  today.    Will continue to monitor levels regularly (every 3-4 mo on average) to keep levels within normal limits and prevent over supplementation.   TREATMENT PLAN FOR OBESITY: BMI 40.0-44.9, adult (HCC) - current BMI 41.33 Class 3 severe obesity with serious comorbidity and body mass index (BMI) of 40.0 to 44.9 in adult, unspecified obesity type Tracy [name] - [facility] [name]) Assessment: Tracy [name] is here to discuss her progress with her obesity treatment plan along with follow-up of her obesity related diagnoses. See Medical Weight Management Flowsheet for complete bioelectrical impedance results.  Condition is improving.  Biometric data collected today, was reviewed with patient.   Since last office visit on 03/09/23 patient's  Muscle mass has increased by 1.2 lb. Fat mass has decreased by 4.8 lb. Total body water has decreased by 3.6 lb.  Counseling done on how various foods will affect these numbers and how to maximize success  Total lbs lost to date: 14 Total weight loss percentage to date: 5.83   Plan: Continue the Category 2 meal plan with breakfast options and only 100 snack calories and journaling 1150-1250 calories and 80+ grams of protein daily.   - I again encouraged Tracy [name] to journal her intake for increased mindfulness/awareness about her eating habits.  Behavioral Intervention Additional  resources provided today: Food log handout Evidence-based interventions for health behavior change were utilized today including the discussion of self monitoring techniques, problem-solving barriers and SMART goal setting techniques.   Regarding patient's less desirable eating habits and patterns, we employed the technique of small changes.  Pt will specifically work on: increasing her lean protein intake, journaling her intake/bringing food log, walking a mile every other day for next visit.    Recommended Physical Activity Goals  Tracy [name] has been advised to slowly work up to 150 minutes of moderate intensity aerobic activity a week and strengthening exercises 2-3 times per week for cardiovascular health, weight loss maintenance and preservation of muscle mass.   She has agreed to Continue current level of physical activity   FOLLOW UP: Return in about 2 weeks (around 04/06/2023). She was informed of the importance of frequent follow up visits to maximize her success with intensive lifestyle modifications for her multiple health conditions.  Subjective:   Chief complaint: Obesity Tracy [name] is here to discuss her progress with her obesity treatment plan. She is on the the Category 2 Plan with breakfast options and only 100 snack calories and keeping a food journal and adhering to recommended goals of 1150-1250 calories and 80+protein and states she is following her eating plan approximately 50% of the time. She states she is not doing any formal exercise, however she was walking a [name] during camp.   Interval History:  Tracy [name] is here for a follow up office visit.  Since last OV, Tracy [name] has been doing well. Last wk, she was at a camp and endorses eating off plan foods during that time. However, during the camp, she reports being very active with walking. She has not been journaling her intake, but expresses interest in starting.   Review of Systems:  Pertinent positives were addressed with  patient today.  Reviewed by clinician on day of visit: allergies, medications, problem list, medical history, surgical history, family history, social history, and previous encounter notes.  Weight Summary and Biometrics   Weight Lost Since Last Visit: 4lb  Weight Gained Since Last Visit: 0    Vitals Temp: 97.9 F (36.6 C) BP: 123/84 Pulse Rate: 63 SpO2: 99 %   Anthropometric Measurements Height: 5\' 2"  (1.575 m) Weight: 226 lb (102.5 kg) BMI (Calculated): 41.33 Weight at Last Visit: 230lb Weight Lost Since Last Visit: 4lb Weight Gained Since Last Visit: 0 Starting Weight: 240.8lb Total Weight Loss (lbs): 14 lb (6.35 kg) Peak Weight: 290lb   Body Composition  Body Fat %: 50.2 % Fat Mass (lbs): 113.8 lbs Muscle Mass (lbs): 107 lbs Total Body Water (lbs): 81.6 lbs Visceral Fat Rating : 15   Other Clinical Data Fasting: no Labs: no Today's Visit #: 4 Starting Date: 01/28/23    Objective:   PHYSICAL EXAM: Blood pressure 123/84, pulse 63, temperature 97.9 F (36.6 C), height 5\' 2"  (1.575 m), weight 226 lb (102.5 kg), last menstrual period 01/17/2014, SpO2 99 %. Body mass index is 41.34 kg/m.  General: Well Developed, well nourished, and in no acute distress.  HEENT: Normocephalic, atraumatic Skin: Warm and dry, cap  RF less 2 sec, good turgor Chest:  Normal excursion, shape, no gross abn Respiratory: speaking in full sentences, no conversational dyspnea NeuroM-Sk: Ambulates w/o assistance, moves * 4 Psych: A and O *3, insight good, mood-full  DIAGNOSTIC DATA REVIEWED:  BMET    Component Value Date/Time   NA 141 01/28/2023 0955   K 4.5 01/28/2023 0955   CL 102 01/28/2023 0955   CO2 23 01/28/2023 0955   GLUCOSE 87 01/28/2023 0955   GLUCOSE 106 (H) 01/24/2014 1106   BUN 22 01/28/2023 0955   CREATININE 0.79 01/28/2023 0955   CALCIUM 9.5 01/28/2023 0955   GFRNONAA >90 01/24/2014 1106   GFRAA >90 01/24/2014 1106   Lab Results  Component Value Date    HGBA1C 5.8 (H) 01/28/2023   Lab Results  Component Value Date   INSULIN 9.0 01/28/2023   Lab Results  Component Value Date   TSH 8.500 (H) 01/28/2023   CBC    Component Value Date/Time   WBC 6.4 01/28/2023 0955   WBC 9.4 01/24/2014 1106   RBC 3.87 01/28/2023 0955   RBC 4.00 01/24/2014 1106   HGB 11.3 01/28/2023 0955   HCT 35.4 01/28/2023 0955   PLT 340 01/28/2023 0955   MCV 92 01/28/2023 0955   MCH 29.2 01/28/2023 0955   MCH 29.5 01/24/2014 1106   MCHC 31.9 01/28/2023 0955   MCHC 33.4 01/24/2014 1106   RDW 12.5 01/28/2023 0955   Iron Studies No results found for: "IRON", "TIBC", "FERRITIN", "IRONPCTSAT" Lipid Panel     Component Value Date/Time   CHOL 192 01/28/2023 0955   TRIG 98 01/28/2023 0955   HDL 59 01/28/2023 0955   CHOLHDL 3.3 01/28/2023 0955   LDLCALC 115 (H) 01/28/2023 0955   Hepatic Function Panel     Component Value Date/Time   PROT 6.9 01/28/2023 0955   ALBUMIN 4.3 01/28/2023 0955   AST 18 01/28/2023 0955   ALT 17 01/28/2023 0955   ALKPHOS 61 01/28/2023 0955   BILITOT <0.2 01/28/2023 0955      Component Value Date/Time   TSH 8.500 (H) 01/28/2023 0955   Nutritional Lab Results  Component Value Date   VD25OH 39.2 01/28/2023    Attestations:   I, Tracy [name], acting as a Stage manager for [employer], DO., have compiled all relevant documentation for today's office visit on behalf of Tracy Lot, DO, while in the presence of [employer].  I have reviewed the above documentation for accuracy and completeness, and I agree with the above. Tracy [name], D.O.  The 21st Century Cures Act was signed into law in 2016 which includes the topic of electronic health records.  This provides immediate access to information in MyChart.  This includes consultation notes, operative notes, office notes, lab results and pathology reports.  If you have any questions about what you read please let us know at your next visit so we can  discuss your concerns and take corrective action if need be.  We are right here with you.

## 2023-04-06 ENCOUNTER — Telehealth (INDEPENDENT_AMBULATORY_CARE_PROVIDER_SITE_OTHER): Payer: BC Managed Care – PPO | Admitting: Psychology

## 2023-04-06 DIAGNOSIS — F411 Generalized anxiety disorder: Secondary | ICD-10-CM | POA: Diagnosis not present

## 2023-04-06 DIAGNOSIS — F5089 Other specified eating disorder: Secondary | ICD-10-CM | POA: Diagnosis not present

## 2023-04-06 NOTE — Progress Notes (Signed)
  Office: [phone]  /  Fax: [phone]    Date: April 06, 2023  Appointment Start Time: 11:01am Duration: 19 minutes Provider: Lawerance Cruel, Psy.D. Type of Session: Individual Therapy  Location of Patient: Home (private location) Location of Provider: Provider's Home (private office) Type of Contact: Telepsychological Visit via MyChart Video Visit  Session Content: Tracy [name] is a 52 y.o. female presenting for a follow-up appointment to address the previously established treatment goal of increasing coping skills.Today's appointment was a telepsychological visit. Tracy [name] provided verbal consent for today's telepsychological appointment and she is aware she is responsible for securing confidentiality on her end of the session. Prior to proceeding with today's appointment, Tracy [name]'s physical location at the time of this appointment was obtained as well a phone number she could be reached at in the event of technical difficulties. Tracy [name] and this provider participated in today's telepsychological service.   This provider conducted a brief check-in. Tracy [name] described her eating habits as "off and on." Since the last appointment with this provider, she denied engagement in binge eating behaviors, restricting food intake, and laxative use. She shared about a typical day of eating. Psychoeducation provided regarding self-compassion. Tracy [name] was engaged in a self-compassion exercise to help with eating-related challenges and other ongoing stressors. She was encouraged to regularly ask herself, "What do I need right now?" and "How can I comfort and care for myself in this moment?" Overall, Tracy [name] was receptive to today's appointment as evidenced by openness to sharing, responsiveness to feedback, and willingness to work toward increasing self-compassion.  Mental Status Examination:  Appearance: neat Behavior: appropriate to circumstances Mood: neutral Affect: mood congruent Speech: WNL Eye Contact:  appropriate Psychomotor Activity: WNL Gait: unable to assess Thought Process: linear, logical, and goal directed and no evidence or endorsement of suicidal, homicidal, and self-harm ideation, plan and intent  Thought Content/Perception: no hallucinations, delusions, bizarre thinking or behavior endorsed or observed Orientation: AAOx4 Memory/Concentration: intact Insight: fair Judgment: fair  Interventions:  Conducted a brief chart review Provided empathic reflections and validation Employed supportive psychotherapy interventions to facilitate reduced distress and to improve coping skills with identified stressors Psychoeducation provided regarding self-compassion Engaged pt in a self-compassion exercise  DSM-5 Diagnosis(es): F50.89 Other Specified Feeding or Eating Disorder, Emotional and Binge Eating Behaviors  Treatment Goal & Progress: During the initial appointment with this provider, the following treatment goal was established: increase coping skills. Tracy [name] has demonstrated progress in her goal as evidenced by increased awareness of hunger patterns and increased awareness of triggers for emotional eating behaviors. Tracy [name] also continues to demonstrate willingness to engage in learned skill(s).  Plan: The next appointment is scheduled for 05/04/2023 at 4pm, which will be via MyChart Video Visit. The next session will focus on working towards the established treatment goal.

## 2023-04-13 ENCOUNTER — Ambulatory Visit (INDEPENDENT_AMBULATORY_CARE_PROVIDER_SITE_OTHER): Payer: BC Managed Care – PPO | Admitting: Family Medicine

## 2023-04-13 DIAGNOSIS — Z6841 Body Mass Index (BMI) 40.0 and over, adult: Secondary | ICD-10-CM | POA: Diagnosis not present

## 2023-04-13 DIAGNOSIS — R7989 Other specified abnormal findings of blood chemistry: Secondary | ICD-10-CM | POA: Diagnosis not present

## 2023-04-13 DIAGNOSIS — D649 Anemia, unspecified: Secondary | ICD-10-CM | POA: Diagnosis not present

## 2023-04-13 DIAGNOSIS — E559 Vitamin D deficiency, unspecified: Secondary | ICD-10-CM | POA: Diagnosis not present

## 2023-04-13 DIAGNOSIS — E039 Hypothyroidism, unspecified: Secondary | ICD-10-CM | POA: Diagnosis not present

## 2023-04-13 DIAGNOSIS — R7303 Prediabetes: Secondary | ICD-10-CM | POA: Diagnosis not present

## 2023-04-13 DIAGNOSIS — I10 Essential (primary) hypertension: Secondary | ICD-10-CM | POA: Diagnosis not present

## 2023-04-17 DIAGNOSIS — D649 Anemia, unspecified: Secondary | ICD-10-CM | POA: Diagnosis not present

## 2023-04-17 DIAGNOSIS — E039 Hypothyroidism, unspecified: Secondary | ICD-10-CM | POA: Diagnosis not present

## 2023-04-26 ENCOUNTER — Other Ambulatory Visit (INDEPENDENT_AMBULATORY_CARE_PROVIDER_SITE_OTHER): Payer: Self-pay | Admitting: Family Medicine

## 2023-04-26 DIAGNOSIS — E559 Vitamin D deficiency, unspecified: Secondary | ICD-10-CM

## 2023-04-27 ENCOUNTER — Ambulatory Visit (INDEPENDENT_AMBULATORY_CARE_PROVIDER_SITE_OTHER): Payer: BC Managed Care – PPO | Admitting: Family Medicine

## 2023-05-04 ENCOUNTER — Telehealth (INDEPENDENT_AMBULATORY_CARE_PROVIDER_SITE_OTHER): Payer: BC Managed Care – PPO | Admitting: Psychology

## 2023-06-08 DIAGNOSIS — R944 Abnormal results of kidney function studies: Secondary | ICD-10-CM | POA: Diagnosis not present

## 2023-06-08 DIAGNOSIS — E039 Hypothyroidism, unspecified: Secondary | ICD-10-CM | POA: Diagnosis not present

## 2023-06-08 DIAGNOSIS — E1069 Type 1 diabetes mellitus with other specified complication: Secondary | ICD-10-CM | POA: Diagnosis not present

## 2023-06-08 DIAGNOSIS — I10 Essential (primary) hypertension: Secondary | ICD-10-CM | POA: Diagnosis not present

## 2023-06-22 DIAGNOSIS — F411 Generalized anxiety disorder: Secondary | ICD-10-CM | POA: Diagnosis not present

## 2023-07-13 DIAGNOSIS — I10 Essential (primary) hypertension: Secondary | ICD-10-CM | POA: Diagnosis not present

## 2023-07-13 DIAGNOSIS — E559 Vitamin D deficiency, unspecified: Secondary | ICD-10-CM | POA: Diagnosis not present

## 2023-07-13 DIAGNOSIS — R7301 Impaired fasting glucose: Secondary | ICD-10-CM | POA: Diagnosis not present

## 2023-07-13 DIAGNOSIS — E1069 Type 1 diabetes mellitus with other specified complication: Secondary | ICD-10-CM | POA: Diagnosis not present

## 2023-07-17 DIAGNOSIS — F411 Generalized anxiety disorder: Secondary | ICD-10-CM | POA: Diagnosis not present

## 2023-07-29 DIAGNOSIS — Z01419 Encounter for gynecological examination (general) (routine) without abnormal findings: Secondary | ICD-10-CM | POA: Diagnosis not present

## 2023-07-29 DIAGNOSIS — Z1231 Encounter for screening mammogram for malignant neoplasm of breast: Secondary | ICD-10-CM | POA: Diagnosis not present

## 2023-07-29 DIAGNOSIS — Z6841 Body Mass Index (BMI) 40.0 and over, adult: Secondary | ICD-10-CM | POA: Diagnosis not present

## 2023-07-30 ENCOUNTER — Other Ambulatory Visit (HOSPITAL_BASED_OUTPATIENT_CLINIC_OR_DEPARTMENT_OTHER): Payer: Self-pay | Admitting: Obstetrics and Gynecology

## 2023-07-30 DIAGNOSIS — Z8249 Family history of ischemic heart disease and other diseases of the circulatory system: Secondary | ICD-10-CM

## 2023-08-13 DIAGNOSIS — I10 Essential (primary) hypertension: Secondary | ICD-10-CM | POA: Diagnosis not present

## 2023-08-13 DIAGNOSIS — F331 Major depressive disorder, recurrent, moderate: Secondary | ICD-10-CM | POA: Diagnosis not present

## 2023-08-13 DIAGNOSIS — Z713 Dietary counseling and surveillance: Secondary | ICD-10-CM | POA: Diagnosis not present

## 2023-08-13 DIAGNOSIS — E039 Hypothyroidism, unspecified: Secondary | ICD-10-CM | POA: Diagnosis not present

## 2023-08-31 DIAGNOSIS — F411 Generalized anxiety disorder: Secondary | ICD-10-CM | POA: Diagnosis not present

## 2023-09-07 ENCOUNTER — Ambulatory Visit (HOSPITAL_COMMUNITY): Payer: Self-pay

## 2023-10-12 ENCOUNTER — Ambulatory Visit (HOSPITAL_COMMUNITY)
Admission: RE | Admit: 2023-10-12 | Discharge: 2023-10-12 | Disposition: A | Discharge status: Home / Self Care | Payer: Self-pay | Source: Ambulatory Visit | Attending: Obstetrics and Gynecology | Admitting: Obstetrics and Gynecology

## 2023-10-12 DIAGNOSIS — E559 Vitamin D deficiency, unspecified: Secondary | ICD-10-CM | POA: Diagnosis not present

## 2023-10-12 DIAGNOSIS — I10 Essential (primary) hypertension: Secondary | ICD-10-CM | POA: Diagnosis not present

## 2023-10-12 DIAGNOSIS — D649 Anemia, unspecified: Secondary | ICD-10-CM | POA: Diagnosis not present

## 2023-10-12 DIAGNOSIS — E039 Hypothyroidism, unspecified: Secondary | ICD-10-CM | POA: Diagnosis not present

## 2023-10-12 DIAGNOSIS — Z8249 Family history of ischemic heart disease and other diseases of the circulatory system: Secondary | ICD-10-CM | POA: Insufficient documentation

## 2023-10-20 DIAGNOSIS — E559 Vitamin D deficiency, unspecified: Secondary | ICD-10-CM | POA: Diagnosis not present

## 2023-10-20 DIAGNOSIS — E039 Hypothyroidism, unspecified: Secondary | ICD-10-CM | POA: Diagnosis not present

## 2023-10-20 DIAGNOSIS — E1069 Type 1 diabetes mellitus with other specified complication: Secondary | ICD-10-CM | POA: Diagnosis not present

## 2023-10-20 DIAGNOSIS — N951 Menopausal and female climacteric states: Secondary | ICD-10-CM | POA: Diagnosis not present

## 2023-10-20 DIAGNOSIS — E611 Iron deficiency: Secondary | ICD-10-CM | POA: Diagnosis not present

## 2023-10-20 DIAGNOSIS — Z713 Dietary counseling and surveillance: Secondary | ICD-10-CM | POA: Diagnosis not present

## 2023-11-02 DIAGNOSIS — F411 Generalized anxiety disorder: Secondary | ICD-10-CM | POA: Diagnosis not present

## 2023-12-28 DIAGNOSIS — F411 Generalized anxiety disorder: Secondary | ICD-10-CM | POA: Diagnosis not present

## 2024-02-29 ENCOUNTER — Encounter: Payer: Self-pay | Admitting: Podiatry

## 2024-02-29 NOTE — Progress Notes (Signed)
Patient did not show for scheduled appointment today.

## 2024-04-11 DIAGNOSIS — E611 Iron deficiency: Secondary | ICD-10-CM | POA: Diagnosis not present

## 2024-04-11 DIAGNOSIS — Z0001 Encounter for general adult medical examination with abnormal findings: Secondary | ICD-10-CM | POA: Diagnosis not present

## 2024-04-11 DIAGNOSIS — I10 Essential (primary) hypertension: Secondary | ICD-10-CM | POA: Diagnosis not present

## 2024-04-11 DIAGNOSIS — E538 Deficiency of other specified B group vitamins: Secondary | ICD-10-CM | POA: Diagnosis not present

## 2024-04-11 DIAGNOSIS — F331 Major depressive disorder, recurrent, moderate: Secondary | ICD-10-CM | POA: Diagnosis not present

## 2024-04-11 DIAGNOSIS — D649 Anemia, unspecified: Secondary | ICD-10-CM | POA: Diagnosis not present

## 2024-04-11 DIAGNOSIS — E559 Vitamin D deficiency, unspecified: Secondary | ICD-10-CM | POA: Diagnosis not present

## 2024-04-11 DIAGNOSIS — E039 Hypothyroidism, unspecified: Secondary | ICD-10-CM | POA: Diagnosis not present

## 2024-04-25 DIAGNOSIS — F411 Generalized anxiety disorder: Secondary | ICD-10-CM | POA: Diagnosis not present

## 2024-06-06 DIAGNOSIS — F411 Generalized anxiety disorder: Secondary | ICD-10-CM | POA: Diagnosis not present

## 2024-06-28 NOTE — Progress Notes (Signed)
 Tracy [name]                                          MRN: [id number]   06/28/2024   The [facility] Quality Team Specialist reviewed this patient medical record for the purposes of chart review for care gap closure. The following were reviewed: chart review for care gap closure-kidney health evaluation for diabetes:eGFR  and uACR.    [facility] Quality Team

## 2024-08-29 DIAGNOSIS — F411 Generalized anxiety disorder: Secondary | ICD-10-CM | POA: Diagnosis not present

## 2024-09-14 DIAGNOSIS — E611 Iron deficiency: Secondary | ICD-10-CM | POA: Diagnosis not present

## 2024-09-14 DIAGNOSIS — F331 Major depressive disorder, recurrent, moderate: Secondary | ICD-10-CM | POA: Diagnosis not present

## 2024-09-14 DIAGNOSIS — I10 Essential (primary) hypertension: Secondary | ICD-10-CM | POA: Diagnosis not present

## 2024-09-14 DIAGNOSIS — E039 Hypothyroidism, unspecified: Secondary | ICD-10-CM | POA: Diagnosis not present
